# Patient Record
Sex: Male | Born: 1960 | Race: White | Hispanic: No | Marital: Married | Smoking: Never smoker
Health system: Southern US, Community
[De-identification: ages and names within clinical notes are randomized; demographics above are authoritative.]

## PROBLEM LIST (undated history)

## (undated) DIAGNOSIS — C6290 Malignant neoplasm of unspecified testis, unspecified whether descended or undescended: Secondary | ICD-10-CM

## (undated) DIAGNOSIS — J189 Pneumonia, unspecified organism: Secondary | ICD-10-CM

## (undated) HISTORY — DX: Malignant neoplasm of unspecified testis, unspecified whether descended or undescended: C62.90

## (undated) HISTORY — PX: WISDOM TOOTH EXTRACTION: SHX21

---

## 1982-11-02 DIAGNOSIS — C6290 Malignant neoplasm of unspecified testis, unspecified whether descended or undescended: Secondary | ICD-10-CM

## 1982-11-02 HISTORY — PX: OTHER SURGICAL HISTORY: SHX169

## 1982-11-02 HISTORY — PX: EXPLORATORY LAPAROTOMY: SUR591

## 1982-11-02 HISTORY — DX: Malignant neoplasm of unspecified testis, unspecified whether descended or undescended: C62.90

## 2002-09-15 ENCOUNTER — Encounter: Admission: RE | Admit: 2002-09-15 | Discharge: 2002-09-15 | Payer: Self-pay | Admitting: Sports Medicine

## 2003-06-08 ENCOUNTER — Encounter: Admission: RE | Admit: 2003-06-08 | Discharge: 2003-06-08 | Payer: Self-pay | Admitting: Sports Medicine

## 2003-06-15 ENCOUNTER — Encounter: Admission: RE | Admit: 2003-06-15 | Discharge: 2003-06-15 | Payer: Self-pay | Admitting: Sports Medicine

## 2005-01-23 ENCOUNTER — Encounter: Admission: RE | Admit: 2005-01-23 | Discharge: 2005-01-23 | Payer: Self-pay | Admitting: Surgery

## 2005-06-05 ENCOUNTER — Encounter: Payer: Self-pay | Admitting: Family Medicine

## 2007-02-09 ENCOUNTER — Encounter: Payer: Self-pay | Admitting: Family Medicine

## 2009-06-17 ENCOUNTER — Ambulatory Visit: Payer: Self-pay | Admitting: Family Medicine

## 2009-06-17 DIAGNOSIS — J45909 Unspecified asthma, uncomplicated: Secondary | ICD-10-CM

## 2009-06-18 LAB — CONVERTED CEMR LAB
ALT: 45 units/L (ref 0–53)
AST: 49 units/L — ABNORMAL HIGH (ref 0–37)
Albumin: 4.7 g/dL (ref 3.5–5.2)
Alkaline Phosphatase: 71 units/L (ref 39–117)
Basophils Absolute: 0 10*3/uL (ref 0.0–0.1)
Basophils Relative: 0.1 % (ref 0.0–3.0)
Calcium: 10 mg/dL (ref 8.4–10.5)
Eosinophils Relative: 0.4 % (ref 0.0–5.0)
GFR calc non Af Amer: 68.53 mL/min (ref >60)
HCT: 42.5 % (ref 39.0–52.0)
HDL: 51.3 mg/dL (ref >39.00)
Hemoglobin: 14.5 g/dL (ref 13.0–17.0)
Lymphocytes Relative: 17.1 % (ref 12.0–46.0)
Lymphs Abs: 0.9 10*3/uL (ref 0.7–4.0)
Monocytes Relative: 9.8 % (ref 3.0–12.0)
Neutro Abs: 4 10*3/uL (ref 1.4–7.7)
Potassium: 4.5 meq/L (ref 3.5–5.1)
RBC: 4.57 M/uL (ref 4.22–5.81)
Sodium: 147 meq/L — ABNORMAL HIGH (ref 135–145)
Total CHOL/HDL Ratio: 3
Total Protein: 6.7 g/dL (ref 6.0–8.3)
VLDL: 12 mg/dL (ref 0.0–40.0)

## 2009-06-19 ENCOUNTER — Encounter: Payer: Self-pay | Admitting: Family Medicine

## 2009-08-09 ENCOUNTER — Ambulatory Visit: Payer: Self-pay | Admitting: Family Medicine

## 2009-08-12 LAB — CONVERTED CEMR LAB
ALT: 27 units/L (ref 0–53)
AST: 35 units/L (ref 0–37)
Albumin: 4.5 g/dL (ref 3.5–5.2)
Alkaline Phosphatase: 70 units/L (ref 39–117)
Basophils Absolute: 0 10*3/uL (ref 0.0–0.1)
Basophils Relative: 0.5 % (ref 0.0–3.0)
Bilirubin, Direct: 0.1 mg/dL (ref 0.0–0.3)
Eosinophils Absolute: 0.1 10*3/uL (ref 0.0–0.7)
Eosinophils Relative: 1.7 % (ref 0.0–5.0)
HCT: 43.2 % (ref 39.0–52.0)
Hemoglobin: 14.9 g/dL (ref 13.0–17.0)
Lymphocytes Relative: 26.3 % (ref 12.0–46.0)
Lymphs Abs: 1.3 10*3/uL (ref 0.7–4.0)
MCHC: 34.4 g/dL (ref 30.0–36.0)
MCV: 94.3 fL (ref 78.0–100.0)
Monocytes Absolute: 0.6 10*3/uL (ref 0.1–1.0)
Monocytes Relative: 10.9 % (ref 3.0–12.0)
Neutro Abs: 3.1 10*3/uL (ref 1.4–7.7)
Neutrophils Relative %: 60.6 % (ref 43.0–77.0)
Platelets: 141 10*3/uL — ABNORMAL LOW (ref 150.0–400.0)
RBC: 4.58 M/uL (ref 4.22–5.81)
RDW: 12.3 % (ref 11.5–14.6)
Total Bilirubin: 1 mg/dL (ref 0.3–1.2)
Total Protein: 6.2 g/dL (ref 6.0–8.3)
WBC: 5.1 10*3/uL (ref 4.5–10.5)

## 2009-09-16 ENCOUNTER — Ambulatory Visit: Payer: Self-pay | Admitting: Family Medicine

## 2009-09-16 DIAGNOSIS — H66009 Acute suppurative otitis media without spontaneous rupture of ear drum, unspecified ear: Secondary | ICD-10-CM | POA: Insufficient documentation

## 2009-09-16 DIAGNOSIS — J0100 Acute maxillary sinusitis, unspecified: Secondary | ICD-10-CM

## 2009-09-23 ENCOUNTER — Telehealth: Payer: Self-pay | Admitting: Family Medicine

## 2009-10-11 ENCOUNTER — Ambulatory Visit: Payer: Self-pay | Admitting: Family Medicine

## 2009-10-23 ENCOUNTER — Telehealth: Payer: Self-pay | Admitting: Family Medicine

## 2010-10-01 ENCOUNTER — Telehealth: Payer: Self-pay | Admitting: Family Medicine

## 2010-10-06 ENCOUNTER — Ambulatory Visit: Payer: Self-pay | Admitting: Family Medicine

## 2010-10-16 ENCOUNTER — Ambulatory Visit: Payer: Self-pay | Admitting: Family Medicine

## 2010-10-16 LAB — CONVERTED CEMR LAB
Alkaline Phosphatase: 71 units/L (ref 39–117)
Basophils Absolute: 0 10*3/uL (ref 0.0–0.1)
Bilirubin, Direct: 0.2 mg/dL (ref 0.0–0.3)
Blood, UA: NEGATIVE
CO2: 27 meq/L (ref 19–32)
Calcium: 9.6 mg/dL (ref 8.4–10.5)
Creatinine, Ser: 0.9 mg/dL (ref 0.4–1.5)
Eosinophils Absolute: 0.1 10*3/uL (ref 0.0–0.7)
Glucose, Bld: 77 mg/dL (ref 70–99)
HDL: 54.2 mg/dL (ref >39.00)
Lymphocytes Relative: 30 % (ref 12.0–46.0)
MCHC: 33.9 g/dL (ref 30.0–36.0)
Neutrophils Relative %: 55 % (ref 43.0–77.0)
Nitrite: NEGATIVE
RDW: 13.1 % (ref 11.5–14.6)
Total CHOL/HDL Ratio: 3
Total Protein, Urine: 30 mg/dL
Triglycerides: 72 mg/dL (ref 0.0–149.0)
pH: 5.5 (ref 5.0–8.0)

## 2010-10-22 ENCOUNTER — Ambulatory Visit: Payer: Self-pay | Admitting: Family Medicine

## 2010-10-23 ENCOUNTER — Ambulatory Visit: Payer: Self-pay | Admitting: Family Medicine

## 2010-12-02 NOTE — Progress Notes (Signed)
Summary: Pt req to get psa added to cpx labs  Phone Note Call from Patient Call back at Home Phone [phone]   Caller: Patient Summary of Call: Pt called and is req to has psa added to cpx labs on 10/16/10. Pls advise.  Initial call taken by: Lucy Antigua,  October 01, 2010 11:57 AM  Follow-up for Phone Call        OK to add Follow-up by: [name] MD,  October 01, 2010 12:13 PM  Additional Follow-up for Phone Call Additional follow up Details #1::        Pt has been notified and psa lab has been added to cpx labs, as noted.  Additional Follow-up by: Lucy Antigua,  October 01, 2010 1:10 PM

## 2010-12-02 NOTE — Assessment & Plan Note (Signed)
Summary: flu shot/cjr  Nurse Visit Flu Vaccine Consent Questions     Do you have a history of severe allergic reactions to this vaccine? no    Any prior history of allergic reactions to egg and/or gelatin? no    Do you have a sensitivity to the preservative Thimersol? no    Do you have a past history of Guillan-Barre Syndrome? no    Do you currently have an acute febrile illness? no    Have you ever had a severe reaction to latex? no    Vaccine information given and explained to patient? yes    Are you currently pregnant? no    Lot Number:[id number]   Exp Date:05/02/2011   Site Given  Right Deltoid IM   Vitals Entered By: Sid Falcon LPN (October 06, 2010 12:35 PM)  Allergies: No Known Drug Allergies  Orders Added: 1)  Admin 1st Vaccine [90471] 2)  Flu Vaccine 47yrs + [16109]

## 2010-12-04 NOTE — Assessment & Plan Note (Signed)
Summary: CPX/nn   Vital Signs:  Patient profile:   50 year old male Height:      70.75 inches Weight:      147 pounds BMI:     20.72 Temp:     98.1 degrees F oral Pulse rate:   60 / minute Pulse rhythm:   regular Resp:     12 per minute BP sitting:   118 / 70  (left arm) Cuff size:   regular  Vitals Entered By: Sid Falcon LPN (October 22, 2010 3:10 PM)  History of Present Illness: Here for CPE.  Exercising regularly. Tetanus up to date.  Will turn 50 in a few months. PMH, SH, AND FH  reviewed. Remote hx of testicluar cancer.  Clinical Review Panels:  Immunizations   Last Tetanus Booster:  Historical (10/17/2004)   Last Flu Vaccine:  Fluvax 3+ (10/06/2010)   Last Pneumovax:  Pneumovax (06/17/2009)  Lipid Management   Cholesterol:  181 (10/16/2010)   LDL (bad choesterol):  112 (10/16/2010)   HDL (good cholesterol):  54.20 (10/16/2010)  Diabetes Management   Creatinine:  0.9 (10/16/2010)   Last Flu Vaccine:  Fluvax 3+ (10/06/2010)   Last Pneumovax:  Pneumovax (06/17/2009)  CBC   WBC:  3.7 (10/16/2010)   RBC:  4.70 (10/16/2010)   Hgb:  15.0 (10/16/2010)   Hct:  44.4 (10/16/2010)   Platelets:  128.0 (10/16/2010)   MCV  94.5 (10/16/2010)   MCHC  33.9 (10/16/2010)   RDW  13.1 (10/16/2010)   PMN:  55.0 (10/16/2010)   Lymphs:  30.0 (10/16/2010)   Monos:  12.1 (10/16/2010)   Eosinophils:  1.9 (10/16/2010)   Basophil:  1.0 (10/16/2010)  Complete Metabolic Panel   Glucose:  77 (10/16/2010)   Sodium:  143 (10/16/2010)   Potassium:  4.0 (10/16/2010)   Chloride:  108 (10/16/2010)   CO2:  27 (10/16/2010)   BUN:  22 (10/16/2010)   Creatinine:  0.9 (10/16/2010)   Albumin:  4.3 (10/16/2010)   Total Protein:  6.2 (10/16/2010)   Calcium:  9.6 (10/16/2010)   Total Bili:  0.9 (10/16/2010)   Alk Phos:  71 (10/16/2010)   SGPT (ALT):  25 (10/16/2010)   SGOT (AST):  33 (10/16/2010)   Allergies (verified): No Known Drug Allergies  Past History:  Past Medical  History: Last updated: 06/17/2009 Chicken oox Cancer, Malignancy 1984  Past Surgical History: Last updated: 06/17/2009 Testicular cancer, 1984  Family History: Last updated: 06/17/2009 Family History of Alcoholism/Addiction, blood relative  Social History: Last updated: 06/17/2009 Occupation:  Sales Married Never Smoked Alcohol use-yes Regular exercise-yes  Risk Factors: Exercise: yes (06/17/2009)  Risk Factors: Smoking Status: never (06/17/2009) PMH-FH-SH reviewed for relevance  Review of Systems  The patient denies anorexia, fever, weight loss, weight gain, vision loss, decreased hearing, hoarseness, chest pain, syncope, dyspnea on exertion, peripheral edema, prolonged cough, headaches, hemoptysis, abdominal pain, melena, hematochezia, severe indigestion/heartburn, hematuria, incontinence, genital sores, muscle weakness, suspicious skin lesions, transient blindness, difficulty walking, depression, unusual weight change, abnormal bleeding, enlarged lymph nodes, and testicular masses.    Physical Exam  General:  Well-developed,well-nourished,in no acute distress; alert,appropriate and cooperative throughout examination Eyes:  No corneal or conjunctival inflammation noted. EOMI. Perrla. Funduscopic exam benign, without hemorrhages, exudates or papilledema. Vision grossly normal. Ears:  External ear exam shows no significant lesions or deformities.  Otoscopic examination reveals clear canals, tympanic membranes are intact bilaterally without bulging, retraction, inflammation or discharge. Hearing is grossly normal bilaterally. Mouth:  Oral mucosa and oropharynx without lesions  or exudates.  Teeth in good repair. Neck:  No deformities, masses, or tenderness noted. Lungs:  Normal respiratory effort, chest expands symmetrically. Lungs are clear to auscultation, no crackles or wheezes. Heart:  Normal rate and regular rhythm. S1 and S2 normal without gallop, murmur, click, rub or  other extra sounds. Abdomen:  Bowel sounds positive,abdomen soft and non-tender without masses, organomegaly or hernias noted. Rectal:  No external abnormalities noted. Normal sphincter tone. No rectal masses or tenderness. Genitalia:  one testicle present and without mass. Prostate:  Prostate gland firm and smooth, no enlargement, nodularity, tenderness, mass, asymmetry or induration. Msk:  No deformity or scoliosis noted of thoracic or lumbar spine.   Extremities:  No clubbing, cyanosis, edema, or deformity noted with normal full range of motion of all joints.   Neurologic:  No cranial nerve deficits noted. Station and gait are normal. Plantar reflexes are down-going bilaterally. DTRs are symmetrical throughout. Sensory, motor and coordinative functions appear intact. Skin:  Intact without suspicious lesions or rashes Cervical Nodes:  No lymphadenopathy noted Psych:  good eye contact, not anxious appearing, and not depressed appearing.     Impression & Recommendations:  Problem # 1:  Preventive Health Care (ICD-V70.0) labs reviewed with pt. cont reg exercise.  Will need screenig colonoscopy next year and he will call after age 28.  Complete Medication List: 1)  One-a-day Mens Tabs (Multiple vitamin) .... Once daily 2)  Advair Diskus 100-50 Mcg/dose Aepb (Fluticasone-salmeterol) .... One puff two times a day 3)  Amoxicillin-pot Clavulanate 875-125 Mg Tabs (Amoxicillin-pot clavulanate) .... One by mouth two times a day for 10 days 4)  Vitamin C 1000 Mg Tabs (Ascorbic acid) .... Once daily 5)  Calcium 1500 Mg Tabs (Calcium carbonate) .... Once daily  Patient Instructions: 1)  Consider screening colonoscopy sometime next year after age 53. 2)  Please schedule a follow-up appointment in 1 year.    Orders Added: 1)  Est. Patient 40-64 years [[id number]]

## 2011-08-24 ENCOUNTER — Telehealth: Payer: Self-pay | Admitting: Family Medicine

## 2011-08-24 DIAGNOSIS — Z1211 Encounter for screening for malignant neoplasm of colon: Secondary | ICD-10-CM

## 2011-08-24 NOTE — Telephone Encounter (Signed)
Pt informed on home VM 

## 2011-08-24 NOTE — Telephone Encounter (Signed)
Pt is interested in getting a colonoscopy done. Please contact.

## 2011-08-24 NOTE — Telephone Encounter (Signed)
OK.  I will go ahead with referral.

## 2011-08-24 NOTE — Telephone Encounter (Signed)
Please advise 

## 2011-08-28 ENCOUNTER — Ambulatory Visit (INDEPENDENT_AMBULATORY_CARE_PROVIDER_SITE_OTHER): Payer: PRIVATE HEALTH INSURANCE

## 2011-08-28 DIAGNOSIS — Z23 Encounter for immunization: Secondary | ICD-10-CM

## 2011-09-22 ENCOUNTER — Ambulatory Visit (AMBULATORY_SURGERY_CENTER): Payer: PRIVATE HEALTH INSURANCE | Admitting: *Deleted

## 2011-09-22 VITALS — Ht 71.0 in | Wt 146.5 lb

## 2011-09-22 DIAGNOSIS — Z1211 Encounter for screening for malignant neoplasm of colon: Secondary | ICD-10-CM

## 2011-09-22 MED ORDER — PEG-KCL-NACL-NASULF-NA ASC-C 100 G PO SOLR: ORAL | Status: DC

## 2011-10-02 ENCOUNTER — Ambulatory Visit (AMBULATORY_SURGERY_CENTER): Payer: PRIVATE HEALTH INSURANCE | Admitting: Internal Medicine

## 2011-10-02 ENCOUNTER — Encounter: Payer: Self-pay | Admitting: Internal Medicine

## 2011-10-02 VITALS — HR 48 | Temp 97.2°F | Resp 23 | Ht 71.0 in | Wt 146.0 lb

## 2011-10-02 DIAGNOSIS — Z1211 Encounter for screening for malignant neoplasm of colon: Secondary | ICD-10-CM

## 2011-10-02 MED ORDER — SODIUM CHLORIDE 0.9 % IV SOLN: 500.0000 mL | INTRAVENOUS | Status: DC

## 2011-10-02 NOTE — Progress Notes (Signed)
Per the pt "I have a low heart rate".  Heart rate 47 when first placed on the monitor.  Dr. Rhea Belton is aware. Maw  Pt tolerated the colonoscopy very well. Maw

## 2011-10-02 NOTE — Patient Instructions (Signed)
Discharge instructions given with verbal understanding. Resume previous medications.  

## 2011-10-02 NOTE — Progress Notes (Signed)
Patient did not experience any of the following events: a burn prior to discharge; a fall within the facility; wrong site/side/patient/procedure/implant event; or a hospital transfer or hospital admission upon discharge from the facility. (G8907) Patient did not have preoperative order for IV antibiotic SSI prophylaxis. (G8918)  

## 2011-10-05 ENCOUNTER — Telehealth: Payer: Self-pay

## 2011-10-05 NOTE — Telephone Encounter (Signed)

## 2011-10-19 ENCOUNTER — Other Ambulatory Visit (INDEPENDENT_AMBULATORY_CARE_PROVIDER_SITE_OTHER): Payer: PRIVATE HEALTH INSURANCE

## 2011-10-19 DIAGNOSIS — Z0000 Encounter for general adult medical examination without abnormal findings: Secondary | ICD-10-CM

## 2011-10-19 LAB — HEPATIC FUNCTION PANEL
AST: 37 U/L (ref 0–37)
Alkaline Phosphatase: 68 U/L (ref 39–117)
Bilirubin, Direct: 0.1 mg/dL (ref 0.0–0.3)
Total Bilirubin: 1.1 mg/dL (ref 0.3–1.2)

## 2011-10-19 LAB — POCT URINALYSIS DIPSTICK
Leukocytes, UA: NEGATIVE
Urobilinogen, UA: NEGATIVE
pH, UA: 5

## 2011-10-19 LAB — LIPID PANEL
LDL Cholesterol: 119 mg/dL — ABNORMAL HIGH (ref 0–99)
Total CHOL/HDL Ratio: 3
VLDL: 15.6 mg/dL (ref 0.0–40.0)

## 2011-10-19 LAB — BASIC METABOLIC PANEL
Calcium: 9.6 mg/dL (ref 8.4–10.5)
GFR: 80.97 mL/min (ref >60.00)
Glucose, Bld: 87 mg/dL (ref 70–99)
Sodium: 145 mEq/L (ref 135–145)

## 2011-10-19 LAB — CBC WITH DIFFERENTIAL/PLATELET
Basophils Absolute: 0 10*3/uL (ref 0.0–0.1)
Hemoglobin: 14.1 g/dL (ref 13.0–17.0)
Lymphocytes Relative: 24.7 % (ref 12.0–46.0)
Monocytes Relative: 11.2 % (ref 3.0–12.0)
Neutro Abs: 3 10*3/uL (ref 1.4–7.7)
Neutrophils Relative %: 62 % (ref 43.0–77.0)
Platelets: 144 10*3/uL — ABNORMAL LOW (ref 150.0–400.0)
RDW: 13.6 % (ref 11.5–14.6)

## 2011-10-19 LAB — PSA: PSA: 0.21 ng/mL (ref 0.10–4.00)

## 2011-10-26 ENCOUNTER — Encounter: Payer: Self-pay | Admitting: Family Medicine

## 2011-10-26 ENCOUNTER — Ambulatory Visit (INDEPENDENT_AMBULATORY_CARE_PROVIDER_SITE_OTHER): Payer: PRIVATE HEALTH INSURANCE | Admitting: Family Medicine

## 2011-10-26 VITALS — BP 120/62 | HR 52 | Temp 97.7°F | Resp 12 | Ht 71.75 in | Wt 147.0 lb

## 2011-10-26 DIAGNOSIS — Z0000 Encounter for general adult medical examination without abnormal findings: Secondary | ICD-10-CM

## 2011-10-26 NOTE — Progress Notes (Signed)
  Subjective:    Patient ID: Harold Benton, male    DOB: 1961/04/29, 50 y.o.   MRN: [id number]  HPI  Patient seen for complete physical. Generally very healthy. Runs about 25 miles per week. No regular medications. Immunizations reviewed and up-to-date. Colonoscopy recently normal. Remote history of testicular cancer. Generally feels well. Has occasional problems with erectile dysfunction.   Review of Systems  Constitutional: Negative for fever, activity change, appetite change and fatigue.  HENT: Negative for ear pain, congestion and trouble swallowing.   Eyes: Negative for pain and visual disturbance.  Respiratory: Negative for cough, shortness of breath and wheezing.   Cardiovascular: Negative for chest pain and palpitations.  Gastrointestinal: Negative for nausea, vomiting, abdominal pain, diarrhea, constipation, blood in stool, abdominal distention and rectal pain.  Genitourinary: Negative for dysuria, hematuria and testicular pain.  Musculoskeletal: Negative for joint swelling and arthralgias.  Skin: Negative for rash.  Neurological: Negative for dizziness, syncope and headaches.  Hematological: Negative for adenopathy.  Psychiatric/Behavioral: Negative for confusion and dysphoric mood.       Objective:   Physical Exam  Constitutional: He is oriented to person, place, and time. He appears well-developed and well-nourished. No distress.  HENT:  Head: Normocephalic and atraumatic.  Right Ear: External ear normal.  Left Ear: External ear normal.  Mouth/Throat: Oropharynx is clear and moist.  Eyes: Conjunctivae and EOM are normal. Pupils are equal, round, and reactive to light.  Neck: Normal range of motion. Neck supple. No thyromegaly present.  Cardiovascular: Normal rate, regular rhythm and normal heart sounds.   No murmur heard. Pulmonary/Chest: No respiratory distress. He has no wheezes. He has no rales.  Abdominal: Soft. Bowel sounds are normal. He exhibits no distension  and no mass. There is no tenderness. There is no rebound and no guarding.  Musculoskeletal: He exhibits no edema.  Lymphadenopathy:    He has no cervical adenopathy.  Neurological: He is alert and oriented to person, place, and time. He displays normal reflexes. No cranial nerve deficit.  Skin: No rash noted.  Psychiatric: He has a normal mood and affect.          Assessment & Plan:  Healthy 50 year old male. Labs reviewed with patient and all favorable. Continue regular exercise. Colonoscopy earlier this year normal. Followup complete physical in 1 year

## 2011-10-26 NOTE — Patient Instructions (Signed)
Cialis 5mg  once daily as needed.

## 2011-12-18 ENCOUNTER — Encounter: Payer: Self-pay | Admitting: Family Medicine

## 2011-12-18 ENCOUNTER — Ambulatory Visit (INDEPENDENT_AMBULATORY_CARE_PROVIDER_SITE_OTHER): Payer: PRIVATE HEALTH INSURANCE | Admitting: Family Medicine

## 2011-12-18 VITALS — BP 108/68 | Temp 98.3°F | Wt 148.0 lb

## 2011-12-18 DIAGNOSIS — J209 Acute bronchitis, unspecified: Secondary | ICD-10-CM

## 2011-12-18 MED ORDER — ALBUTEROL SULFATE HFA 108 (90 BASE) MCG/ACT IN AERS: 2.0000 | INHALATION_SPRAY | Freq: Four times a day (QID) | RESPIRATORY_TRACT | Status: DC | PRN

## 2011-12-18 MED ORDER — HYDROCODONE-HOMATROPINE 5-1.5 MG/5ML PO SYRP: 5.0000 mL | ORAL_SOLUTION | Freq: Four times a day (QID) | ORAL | Status: AC | PRN

## 2011-12-18 NOTE — Progress Notes (Signed)
  Subjective:    Patient ID: Harold Benton, male    DOB: Oct 02, 1961, 51 y.o.   MRN: [id number]  HPI  Acute visit. 2 week history of dry cough. Started with upper history symptoms. He had some nasal congestion initially had some yellowish nasal discharge now clear. Possible mild wheezing off and on. Denies any headaches or localized facial pain. Has tried Robitussin-DM without relief of cough. Cough is especially bothersome at night. Patient is nonsmoker. Does have history of mild intermittent asthma   Review of Systems  Constitutional: Negative for fever and chills.  HENT: Positive for congestion.   Respiratory: Positive for cough and wheezing. Negative for shortness of breath.   Neurological: Negative for headaches.       Objective:   Physical Exam  Constitutional: He appears well-developed and well-nourished.  HENT:  Right Ear: External ear normal.  Left Ear: External ear normal.  Mouth/Throat: Oropharynx is clear and moist.  Neck: Neck supple.  Cardiovascular: Normal rate and regular rhythm.   Pulmonary/Chest: Effort normal and breath sounds normal. No respiratory distress. He has no wheezes. He has no rales.  Lymphadenopathy:    He has no cervical adenopathy.          Assessment & Plan:  Probable viral syndrome. Possible mild reactive airway component. Refill Ventolin for asthma prn use. Hycodan cough syrup for nighttime use. Follow up promptly for fever or worsening symptoms. Consider prednisone taper if cough not improving though no wheezing heard today on exam

## 2011-12-25 ENCOUNTER — Telehealth: Payer: Self-pay | Admitting: Family Medicine

## 2011-12-25 MED ORDER — AMOXICILLIN-POT CLAVULANATE 875-125 MG PO TABS: 1.0000 | ORAL_TABLET | Freq: Two times a day (BID) | ORAL | Status: AC

## 2011-12-25 NOTE — Telephone Encounter (Signed)
Pt called. Was in to see Dr. Leonard Schwartz last Friday. He is ready get an antibiotic now. Last wk they discussed it and he decided to wait a little. Please call something in. Pt uses [facility] in [place].

## 2011-12-25 NOTE — Telephone Encounter (Signed)
Augmentin 875 mg one BID for 10 days

## 2011-12-25 NOTE — Telephone Encounter (Signed)
Please advise 

## 2011-12-25 NOTE — Telephone Encounter (Signed)
Pt informed. Rx sent to pharmacy  

## 2013-03-17 ENCOUNTER — Other Ambulatory Visit (INDEPENDENT_AMBULATORY_CARE_PROVIDER_SITE_OTHER): Payer: PRIVATE HEALTH INSURANCE

## 2013-03-17 DIAGNOSIS — Z0000 Encounter for general adult medical examination without abnormal findings: Secondary | ICD-10-CM

## 2013-03-17 LAB — CBC WITH DIFFERENTIAL/PLATELET
Basophils Absolute: 0.1 10*3/uL (ref 0.0–0.1)
Eosinophils Relative: 0.7 % (ref 0.0–5.0)
Lymphs Abs: 0.7 10*3/uL (ref 0.7–4.0)
MCV: 88.9 fl (ref 78.0–100.0)
Monocytes Absolute: 0.8 10*3/uL (ref 0.1–1.0)
Neutrophils Relative %: 78.5 % — ABNORMAL HIGH (ref 43.0–77.0)
Platelets: 140 10*3/uL — ABNORMAL LOW (ref 150.0–400.0)
RDW: 14.2 % (ref 11.5–14.6)
WBC: 7.7 10*3/uL (ref 4.5–10.5)

## 2013-03-17 LAB — POCT URINALYSIS DIPSTICK
Bilirubin, UA: NEGATIVE
Blood, UA: NEGATIVE
Glucose, UA: NEGATIVE
Leukocytes, UA: NEGATIVE
Nitrite, UA: NEGATIVE
Urobilinogen, UA: 0.2

## 2013-03-17 LAB — BASIC METABOLIC PANEL
BUN: 21 mg/dL (ref 6–23)
Creatinine, Ser: 1 mg/dL (ref 0.4–1.5)
GFR: 85.28 mL/min (ref >60.00)
Glucose, Bld: 79 mg/dL (ref 70–99)

## 2013-03-17 LAB — HEPATIC FUNCTION PANEL
ALT: 30 U/L (ref 0–53)
Bilirubin, Direct: 0.1 mg/dL (ref 0.0–0.3)
Total Bilirubin: 0.9 mg/dL (ref 0.3–1.2)

## 2013-03-17 LAB — LIPID PANEL
Cholesterol: 171 mg/dL (ref 0–200)
HDL: 53.6 mg/dL (ref >39.00)
VLDL: 15.2 mg/dL (ref 0.0–40.0)

## 2013-03-17 LAB — TSH: TSH: 1.62 u[IU]/mL (ref 0.35–5.50)

## 2013-03-17 LAB — PSA: PSA: 0.17 ng/mL (ref 0.10–4.00)

## 2013-03-24 ENCOUNTER — Encounter: Payer: Self-pay | Admitting: Family Medicine

## 2013-03-24 ENCOUNTER — Ambulatory Visit (INDEPENDENT_AMBULATORY_CARE_PROVIDER_SITE_OTHER): Payer: PRIVATE HEALTH INSURANCE | Admitting: Family Medicine

## 2013-03-24 VITALS — BP 130/90 | Temp 98.1°F | Ht 71.25 in | Wt 149.0 lb

## 2013-03-24 DIAGNOSIS — Z0000 Encounter for general adult medical examination without abnormal findings: Secondary | ICD-10-CM

## 2013-03-24 MED ORDER — MONTELUKAST SODIUM 10 MG PO TABS: 10.0000 mg | ORAL_TABLET | Freq: Every day | ORAL | Status: DC

## 2013-03-24 MED ORDER — FLUTICASONE-SALMETEROL 100-50 MCG/DOSE IN AEPB: 1.0000 | INHALATION_SPRAY | Freq: Two times a day (BID) | RESPIRATORY_TRACT | Status: DC

## 2013-03-24 NOTE — Progress Notes (Addendum)
Subjective:    Patient ID: Harold Benton, male    DOB: February 01, 1961, 52 y.o.   MRN: [id number]  HPI Here for complete physical. Patient has history of asthma but took himself off inhalers some time back. He has several months of cough, wheezing, and increased congestion both nasally and his chest. His tried multiple medications including Mucinex, Sudafed, and Advil without much improvement Still running but has increased shortness of breath. No chest pain. Previously took Advair which worked well.  Colonoscopy 2012. Tetanus 2005. Other than issues above, feels well. Remote history of testicle cancer.  Past Medical History  Diagnosis Date  . Testicular cancer 1984   Past Surgical History  Procedure Laterality Date  . Tecticle removal  1984  . Exploratory laparotomy  1984    reports that he has never smoked. He has never used smokeless tobacco. He reports that he drinks about 3.6 ounces of alcohol per week. He reports that he does not use illicit drugs. family history includes Cancer in his father and mother.  There is no history of Colon cancer and Stomach cancer. No Known Allergies    Review of Systems  Constitutional: Negative for fever, activity change, appetite change and fatigue.  HENT: Negative for ear pain, congestion and trouble swallowing.   Eyes: Negative for pain and visual disturbance.  Respiratory: Positive for cough, shortness of breath and wheezing. Negative for apnea.   Cardiovascular: Negative for chest pain and palpitations.  Gastrointestinal: Negative for nausea, vomiting, abdominal pain, diarrhea, constipation, blood in stool, abdominal distention and rectal pain.  Genitourinary: Negative for dysuria, hematuria and testicular pain.  Musculoskeletal: Negative for joint swelling and arthralgias.  Skin: Negative for rash.  Neurological: Negative for dizziness, syncope and headaches.  Hematological: Negative for adenopathy.  Psychiatric/Behavioral: Negative  for confusion and dysphoric mood.       Objective:   Physical Exam  Constitutional: He is oriented to person, place, and time. He appears well-developed and well-nourished. No distress.  HENT:  Head: Normocephalic and atraumatic.  Right Ear: External ear normal.  Left Ear: External ear normal.  Mouth/Throat: Oropharynx is clear and moist.  Eyes: Conjunctivae and EOM are normal. Pupils are equal, round, and reactive to light.  Neck: Normal range of motion. Neck supple. No thyromegaly present.  Cardiovascular: Normal rate, regular rhythm and normal heart sounds.   No murmur heard. Pulmonary/Chest: Effort normal. No respiratory distress. He has wheezes. He has no rales.  Abdominal: Soft. Bowel sounds are normal. He exhibits no distension and no mass. There is no tenderness. There is no rebound and no guarding.  Musculoskeletal: He exhibits no edema.  Lymphadenopathy:    He has no cervical adenopathy.  Neurological: He is alert and oriented to person, place, and time. He displays normal reflexes. No cranial nerve deficit.  Skin: No rash noted.  Psychiatric: He has a normal mood and affect.          Assessment & Plan:  Complete physical. Labs reviewed. Tetanus booster by next year. Colonoscopy up to date. He has evidence for reactive airway disease which is currently not treated. Start back Advair 100 mg one puff twice daily. Singulair 10 mg once daily. Touch base 2 weeks if no significant improvement  Spoke with patient in followup. He just started Advair 2 days ago. Has also started Singulair. Had low-grade fever 100.4 yesterday. Cough with thick sputum. We elected to go ahead and start Zithromax for 5 days. Followup has been scheduled for tomorrow to reassess

## 2013-03-29 ENCOUNTER — Telehealth: Payer: Self-pay | Admitting: Family Medicine

## 2013-03-29 ENCOUNTER — Encounter: Payer: Self-pay | Admitting: Family Medicine

## 2013-03-29 MED ORDER — AZITHROMYCIN 250 MG PO TABS: ORAL_TABLET | ORAL | Status: AC

## 2013-03-29 NOTE — Addendum Note (Signed)
Addended by: [name] on: 03/29/2013 06:12 PM   Modules accepted: Orders

## 2013-03-29 NOTE — Telephone Encounter (Signed)
Patient Information:  Caller Name: Shilo  Phone: [phone]  Patient: Harold Benton, Harold Benton  Gender: Male  DOB: February 27, 1961  Age: 52 Years  PCP: [name] [facility])  Office Follow Up:  Does the office need to follow up with this patient?: No  Instructions For The Office: N/A    Symptoms  Reason For Call & Symptoms: Ongoing Sinus and chest congestion.  Pt seen on 5/23 and prescribed an inhaler and singulair.  Productive cough with green mucus and nasal dicharge as well.  Lots of coughing.  Pt concerned he spiked a fever on 03/27/13 which has resolved.   Reviewed Health History In EMR: Yes  Reviewed Medications In EMR: Yes  Reviewed Allergies In EMR: Yes  Reviewed Surgeries / Procedures: Yes  Date of Onset of Symptoms: 03/08/2013  Treatments Tried: RX Nasal Washes  Treatments Tried Worked: No  Guideline(s) Used:  Sinus Pain and Congestion  Disposition Per Guideline:   See Today or Tomorrow in Office  Reason For Disposition Reached:   Lots of coughing  Advice Given:  Hydration:  Drink plenty of liquids (6-8 glasses of water daily). If the air in your home is dry, use a cool mist humidifier  Call Back If:   You become worse.  Patient Will Follow Care Advice:  YES  Appointment Scheduled:  03/30/2013 16:30:00 Appointment Scheduled Provider:  [name] [facility])

## 2013-03-30 ENCOUNTER — Ambulatory Visit: Payer: Self-pay | Admitting: Family Medicine

## 2013-07-27 ENCOUNTER — Encounter: Payer: Self-pay | Admitting: Family Medicine

## 2013-07-27 MED ORDER — AMOXICILLIN-POT CLAVULANATE 875-125 MG PO TABS: 1.0000 | ORAL_TABLET | Freq: Two times a day (BID) | ORAL | Status: DC

## 2013-07-27 NOTE — Telephone Encounter (Signed)
Patient calls with symptoms of progressive sinus congestion, headaches, eyes matting. History of frequent sinusitis the past. Refill Augmentin 875 mg twice daily for 10 days. Office followup if no better in one week

## 2013-09-01 ENCOUNTER — Ambulatory Visit (INDEPENDENT_AMBULATORY_CARE_PROVIDER_SITE_OTHER): Payer: PRIVATE HEALTH INSURANCE

## 2013-09-01 DIAGNOSIS — Z23 Encounter for immunization: Secondary | ICD-10-CM

## 2014-01-30 ENCOUNTER — Encounter: Payer: Self-pay | Admitting: Family Medicine

## 2014-01-31 ENCOUNTER — Encounter: Payer: Self-pay | Admitting: Family Medicine

## 2014-01-31 ENCOUNTER — Ambulatory Visit (INDEPENDENT_AMBULATORY_CARE_PROVIDER_SITE_OTHER): Payer: PRIVATE HEALTH INSURANCE | Admitting: Family Medicine

## 2014-01-31 VITALS — BP 120/70 | HR 54 | Temp 98.3°F | Wt 150.0 lb

## 2014-01-31 DIAGNOSIS — R05 Cough: Secondary | ICD-10-CM

## 2014-01-31 DIAGNOSIS — R059 Cough, unspecified: Secondary | ICD-10-CM

## 2014-01-31 MED ORDER — AZITHROMYCIN 250 MG PO TABS: ORAL_TABLET | ORAL | Status: AC

## 2014-01-31 NOTE — Progress Notes (Signed)
Pre visit review using our clinic review tool, if applicable. No additional management support is needed unless otherwise documented below in the visit note. 

## 2014-01-31 NOTE — Patient Instructions (Signed)
Consider stepping Advair back up to one puff twice daily

## 2014-01-31 NOTE — Progress Notes (Signed)
   Subjective:    Patient ID: Harold Benton, male    DOB: 08-14-1961, 53 y.o.   MRN: [id number]  Cough Associated symptoms include wheezing. Pertinent negatives include no chills or fever.   Acute. One-week history of difficult. He started with some sore throat and sinus congestion. Low-grade fever off-and-on. He has history of asthma and takes Singulair and Advair no curly taking Advair once daily. Nonsmoker. Cough productive of thick yellow sputum.  Past Medical History  Diagnosis Date  . Testicular cancer 1984   Past Surgical History  Procedure Laterality Date  . Tecticle removal  1984  . Exploratory laparotomy  1984    reports that he has never smoked. He has never used smokeless tobacco. He reports that he drinks about 3.6 ounces of alcohol per week. He reports that he does not use illicit drugs. family history includes Cancer in his father and mother. There is no history of Colon cancer or Stomach cancer. No Known Allergies    Review of Systems  Constitutional: Positive for fatigue. Negative for fever and chills.  HENT: Positive for congestion.   Respiratory: Positive for cough and wheezing.        Objective:   Physical Exam  Constitutional: He appears well-developed and well-nourished.  HENT:  Right Ear: External ear normal.  Left Ear: External ear normal.  Mouth/Throat: Oropharynx is clear and moist.  Neck: Neck supple.  Cardiovascular: Normal rate and regular rhythm.   Pulmonary/Chest: Effort normal and breath sounds normal. No respiratory distress. He has no wheezes. He has no rales.  Lymphadenopathy:    He has no cervical adenopathy.          Assessment & Plan:  Acute upper respiratory illness. He has productive cough and history of asthma. Continue Advair but increase to one puff twice daily. Continue albuterol as needed. Zithromax for 5 days.

## 2014-04-05 ENCOUNTER — Other Ambulatory Visit: Payer: Self-pay | Admitting: Family Medicine

## 2014-04-26 ENCOUNTER — Other Ambulatory Visit: Payer: Self-pay | Admitting: Family Medicine

## 2014-09-24 ENCOUNTER — Telehealth: Payer: Self-pay

## 2014-09-24 ENCOUNTER — Encounter: Payer: Self-pay | Admitting: Family Medicine

## 2014-09-24 NOTE — Telephone Encounter (Signed)
Dr. Elease Hashimoto,     I'm in my 5th week of another sinus/chest congestion episode. I've been taking Phenylephrine HCl (10 mg x 4 daily), Guaifenesin (600 mg x 2 daily), and Advil (200 mg. x 8 daily) for at least 3 weeks. That's in addition to my usual Advair and Cingulair. Mucus still very green from both sinuses and chest. Have felt feverish at times, but Advil keeping that at [place].    I'm ready to try an anti-biotic; would you please prescribe?    RX still at [facility]     Do you want patient to come into office. Last visit 01/31/14

## 2014-09-25 ENCOUNTER — Other Ambulatory Visit: Payer: Self-pay

## 2014-09-25 MED ORDER — AZITHROMYCIN 250 MG PO TABS: ORAL_TABLET | ORAL | Status: DC

## 2014-09-25 NOTE — Telephone Encounter (Signed)
Rx sent to pharmacy   

## 2014-09-25 NOTE — Telephone Encounter (Signed)
Let's start Zithromax (Z-pack) and office follow up if no better after that.

## 2014-10-19 ENCOUNTER — Telehealth: Payer: Self-pay | Admitting: Family Medicine

## 2014-10-19 NOTE — Telephone Encounter (Signed)
Patient states he was on a Z-pack last month and the chest congestion has come back.  Can the medication be re-filled or does he need an appointment?? If he needs an appointment, can it wait so he can see Dr. Elease Hashimoto or go to Saturday clinic?  [id number] - [place], [place] - [address]

## 2014-10-21 NOTE — Telephone Encounter (Signed)
If still having respiratory/infectious symptoms, recommend follow up.

## 2014-10-22 NOTE — Telephone Encounter (Signed)
Patient is scheduled for 10/23/14

## 2014-10-22 NOTE — Telephone Encounter (Signed)
Can you please call patient and set up follow up visit.

## 2014-10-23 ENCOUNTER — Ambulatory Visit (INDEPENDENT_AMBULATORY_CARE_PROVIDER_SITE_OTHER)
Admission: RE | Admit: 2014-10-23 | Discharge: 2014-10-23 | Disposition: A | Discharge status: Home / Self Care | Payer: PRIVATE HEALTH INSURANCE | Source: Ambulatory Visit | Attending: Family Medicine | Admitting: Family Medicine

## 2014-10-23 ENCOUNTER — Ambulatory Visit (INDEPENDENT_AMBULATORY_CARE_PROVIDER_SITE_OTHER): Payer: PRIVATE HEALTH INSURANCE | Admitting: Family Medicine

## 2014-10-23 ENCOUNTER — Encounter: Payer: Self-pay | Admitting: Family Medicine

## 2014-10-23 VITALS — BP 126/68 | HR 67 | Temp 98.5°F | Wt 148.0 lb

## 2014-10-23 DIAGNOSIS — R058 Other specified cough: Secondary | ICD-10-CM

## 2014-10-23 DIAGNOSIS — J4530 Mild persistent asthma, uncomplicated: Secondary | ICD-10-CM

## 2014-10-23 DIAGNOSIS — R05 Cough: Secondary | ICD-10-CM

## 2014-10-23 MED ORDER — FLUTICASONE-SALMETEROL 100-50 MCG/DOSE IN AEPB: 1.0000 | INHALATION_SPRAY | Freq: Two times a day (BID) | RESPIRATORY_TRACT | Status: DC

## 2014-10-23 MED ORDER — LEVOFLOXACIN 500 MG PO TABS: 500.0000 mg | ORAL_TABLET | Freq: Every day | ORAL | Status: DC

## 2014-10-23 NOTE — Progress Notes (Signed)
Pre visit review using our clinic review tool, if applicable. No additional management support is needed unless otherwise documented below in the visit note. 

## 2014-10-23 NOTE — Progress Notes (Signed)
   Subjective:    Patient ID: Harold Benton, male    DOB: Oct 03, 1961, 53 y.o.   MRN: [id number]  HPI Patient seen with upper respiratory symptoms. He has history of asthma and has had recurrent sinusitis in the past. Back in September he had respiratory infection and went on antibiotic and then felt better until around the middle of November. He had recurrent sinusitis symptoms and was treated with Zithromax. He felt slightly better but over the past couple of weeks has had recurrence of cough, sinus congestion, intermittent headache, malaise, occasional chills without documented fever. He has occasional left lung pain but no consistent pleuritic symptoms and no hemoptysis.  He takes Advair once daily and has also been taking over-the-counter medications including Advil, decongestant, Mucinex. Never smoked. Runs for exercise. He does nasal saline irrigation daily. Patient is concerned because he states he's had about 4 or more respiratory infections per year and this seems to be increasing. He takes Singulair daily at baseline and is also has taken antihistamines and nasal steroids in the past. He is not aware of any clear allergens  Past Medical History  Diagnosis Date  . Testicular cancer 1984   Past Surgical History  Procedure Laterality Date  . Tecticle removal  1984  . Exploratory laparotomy  1984    reports that he has never smoked. He has never used smokeless tobacco. He reports that he drinks about 3.6 oz of alcohol per week. He reports that he does not use illicit drugs. family history includes Cancer in his father and mother. There is no history of Colon cancer or Stomach cancer. No Known Allergies    Review of Systems  Constitutional: Positive for chills and fatigue.  HENT: Positive for congestion and sinus pressure.   Respiratory: Positive for cough.   Neurological: Positive for headaches.       Objective:   Physical Exam  Constitutional: He appears well-developed and  well-nourished.  HENT:  Right Ear: External ear normal.  Left Ear: External ear normal.  Nose: Nose normal.  Neck: Neck supple.  Cardiovascular: Normal rate and regular rhythm.  Exam reveals no gallop.   No murmur heard. Pulmonary/Chest: Effort normal and breath sounds normal. No respiratory distress. He has no wheezes. He has no rales.  Lymphadenopathy:    He has no cervical adenopathy.          Assessment & Plan:  Recurrent cough/sinusitis symptoms in a patient with known reactive airway disease. Obtain chest x-ray. Start Levaquin 500 milligrams once daily for 7 days given duration of productive cough. Continue Mucinex. We discussed things like possible allergy testing and possible referral to pulmonologist. He wishes to assess chest x-ray results first. Continue Advair. He does not have evidence first of reactive airway exacerbation on exam today

## 2014-10-24 ENCOUNTER — Telehealth: Payer: Self-pay | Admitting: Family Medicine

## 2014-10-24 NOTE — Telephone Encounter (Signed)
Pt informed

## 2014-10-24 NOTE — Telephone Encounter (Signed)
Pt would like a call back to discuss xray results.

## 2015-04-12 ENCOUNTER — Other Ambulatory Visit (INDEPENDENT_AMBULATORY_CARE_PROVIDER_SITE_OTHER): Payer: PRIVATE HEALTH INSURANCE

## 2015-04-12 DIAGNOSIS — Z0000 Encounter for general adult medical examination without abnormal findings: Secondary | ICD-10-CM

## 2015-04-12 LAB — HEPATIC FUNCTION PANEL
ALK PHOS: 81 U/L (ref 39–117)
ALT: 30 U/L (ref 0–53)
AST: 37 U/L (ref 0–37)
Albumin: 4.4 g/dL (ref 3.5–5.2)
Bilirubin, Direct: 0.2 mg/dL (ref 0.0–0.3)
TOTAL PROTEIN: 6.2 g/dL (ref 6.0–8.3)
Total Bilirubin: 0.7 mg/dL (ref 0.2–1.2)

## 2015-04-12 LAB — BASIC METABOLIC PANEL
BUN: 25 mg/dL — AB (ref 6–23)
CALCIUM: 9.7 mg/dL (ref 8.4–10.5)
CO2: 30 meq/L (ref 19–32)
Chloride: 108 mEq/L (ref 96–112)
Creatinine, Ser: 1.04 mg/dL (ref 0.40–1.50)
GFR: 79 mL/min (ref >60.00)
Glucose, Bld: 87 mg/dL (ref 70–99)
POTASSIUM: 4.9 meq/L (ref 3.5–5.1)
SODIUM: 142 meq/L (ref 135–145)

## 2015-04-12 LAB — CBC WITH DIFFERENTIAL/PLATELET
BASOS ABS: 0 10*3/uL (ref 0.0–0.1)
Basophils Relative: 0.5 % (ref 0.0–3.0)
EOS ABS: 0 10*3/uL (ref 0.0–0.7)
EOS PCT: 0.6 % (ref 0.0–5.0)
HCT: 38.5 % — ABNORMAL LOW (ref 39.0–52.0)
HEMOGLOBIN: 12.7 g/dL — AB (ref 13.0–17.0)
LYMPHS ABS: 1.2 10*3/uL (ref 0.7–4.0)
LYMPHS PCT: 19.1 % (ref 12.0–46.0)
MCHC: 32.9 g/dL (ref 30.0–36.0)
MCV: 87.3 fl (ref 78.0–100.0)
MONO ABS: 0.6 10*3/uL (ref 0.1–1.0)
MONOS PCT: 9.6 % (ref 3.0–12.0)
NEUTROS PCT: 70.2 % (ref 43.0–77.0)
Neutro Abs: 4.4 10*3/uL (ref 1.4–7.7)
Platelets: 155 10*3/uL (ref 150.0–400.0)
RBC: 4.41 Mil/uL (ref 4.22–5.81)
RDW: 15.1 % (ref 11.5–15.5)
WBC: 6.3 10*3/uL (ref 4.0–10.5)

## 2015-04-12 LAB — LIPID PANEL
Cholesterol: 175 mg/dL (ref 0–200)
HDL: 51.9 mg/dL (ref >39.00)
LDL Cholesterol: 100 mg/dL — ABNORMAL HIGH (ref 0–99)
NonHDL: 123.1
Total CHOL/HDL Ratio: 3
Triglycerides: 114 mg/dL (ref 0.0–149.0)
VLDL: 22.8 mg/dL (ref 0.0–40.0)

## 2015-04-12 LAB — PSA: PSA: 0.18 ng/mL (ref 0.10–4.00)

## 2015-04-12 LAB — TSH: TSH: 1.6 u[IU]/mL (ref 0.35–4.50)

## 2015-04-19 ENCOUNTER — Encounter: Payer: Self-pay | Admitting: Family Medicine

## 2015-04-19 ENCOUNTER — Ambulatory Visit (INDEPENDENT_AMBULATORY_CARE_PROVIDER_SITE_OTHER): Payer: PRIVATE HEALTH INSURANCE | Admitting: Family Medicine

## 2015-04-19 VITALS — BP 120/80 | HR 48 | Temp 98.1°F | Ht 70.47 in | Wt 144.0 lb

## 2015-04-19 DIAGNOSIS — Z23 Encounter for immunization: Secondary | ICD-10-CM | POA: Diagnosis not present

## 2015-04-19 DIAGNOSIS — Z0000 Encounter for general adult medical examination without abnormal findings: Secondary | ICD-10-CM | POA: Diagnosis not present

## 2015-04-19 DIAGNOSIS — D649 Anemia, unspecified: Secondary | ICD-10-CM

## 2015-04-19 MED ORDER — SILDENAFIL CITRATE 100 MG PO TABS: 50.0000 mg | ORAL_TABLET | Freq: Every day | ORAL | Status: DC | PRN

## 2015-04-19 NOTE — Patient Instructions (Signed)
Return in approximately one month for repeat labs for follow-up CBC, iron level, and ferritin

## 2015-04-19 NOTE — Progress Notes (Signed)
Pre visit review using our clinic review tool, if applicable. No additional management support is needed unless otherwise documented below in the visit note. 

## 2015-04-19 NOTE — Progress Notes (Signed)
   Subjective:    Patient ID: Harold Benton, male    DOB: 1961/06/22, 54 y.o.   MRN: [id number]  HPI Patient here for complete physical. He has history of asthma and is currently off all regular medications. He stopped his Advair several months ago and no recent wheezing. He runs about 25 miles per week. Last tetanus over 10 years ago.  Had some recent left lateral elbow pain which he attributed to tenderness. Pain with gripping.  Had normal colonoscopy age 33. No recent appetite or weight changes. Nonsmoker.  Has had some issues with erectile dysfunction and requesting prescription for Viagra.  Past Medical History  Diagnosis Date  . Testicular cancer 1984   Past Surgical History  Procedure Laterality Date  . Tecticle removal  1984  . Exploratory laparotomy  1984    reports that he has never smoked. He has never used smokeless tobacco. He reports that he drinks about 3.6 oz of alcohol per week. He reports that he does not use illicit drugs. family history includes Cancer in his father and mother. There is no history of Colon cancer or Stomach cancer. No Known Allergies    Review of Systems  Constitutional: Negative for fever, activity change, appetite change, fatigue and unexpected weight change.  HENT: Negative for congestion, ear pain and trouble swallowing.   Eyes: Negative for pain and visual disturbance.  Respiratory: Negative for cough, shortness of breath and wheezing.   Cardiovascular: Negative for chest pain and palpitations.  Gastrointestinal: Negative for nausea, vomiting, abdominal pain, diarrhea, constipation, blood in stool, abdominal distention and rectal pain.  Endocrine: Negative for polydipsia and polyuria.  Genitourinary: Negative for dysuria, hematuria and testicular pain.  Musculoskeletal: Negative for joint swelling and arthralgias.  Skin: Negative for rash.  Neurological: Negative for dizziness, syncope and headaches.  Hematological: Negative for  adenopathy.  Psychiatric/Behavioral: Negative for confusion and dysphoric mood.       Objective:   Physical Exam  Constitutional: He is oriented to person, place, and time. He appears well-developed and well-nourished. No distress.  HENT:  Head: Normocephalic and atraumatic.  Right Ear: External ear normal.  Left Ear: External ear normal.  Mouth/Throat: Oropharynx is clear and moist.  Eyes: Conjunctivae and EOM are normal. Pupils are equal, round, and reactive to light.  Neck: Normal range of motion. Neck supple. No thyromegaly present.  Cardiovascular: Normal rate, regular rhythm and normal heart sounds.   No murmur heard. Pulmonary/Chest: No respiratory distress. He has no wheezes. He has no rales.  Abdominal: Soft. Bowel sounds are normal. He exhibits no distension and no mass. There is no tenderness. There is no rebound and no guarding.  Musculoskeletal: He exhibits no edema.  Lymphadenopathy:    He has no cervical adenopathy.  Neurological: He is alert and oriented to person, place, and time. He displays normal reflexes. No cranial nerve deficit.  Skin: No rash noted.  Psychiatric: He has a normal mood and affect.          Assessment & Plan:  Complete physical. Labs reviewed. He has mildly low hemoglobin 12.7 with normal MCV-? Significance. No worrisome symptoms. Other labs unremarkable. Tetanus booster given. Repeat CBC in one month and include serum  iron, TIBC, ferritin

## 2015-07-29 ENCOUNTER — Encounter: Payer: Self-pay | Admitting: Family Medicine

## 2015-07-31 ENCOUNTER — Other Ambulatory Visit (INDEPENDENT_AMBULATORY_CARE_PROVIDER_SITE_OTHER): Payer: PRIVATE HEALTH INSURANCE

## 2015-07-31 DIAGNOSIS — D649 Anemia, unspecified: Secondary | ICD-10-CM

## 2015-07-31 LAB — CBC WITH DIFFERENTIAL/PLATELET
BASOS PCT: 0.7 % (ref 0.0–3.0)
Basophils Absolute: 0 10*3/uL (ref 0.0–0.1)
Eosinophils Absolute: 0 10*3/uL (ref 0.0–0.7)
Eosinophils Relative: 0.8 % (ref 0.0–5.0)
HEMATOCRIT: 38.6 % — AB (ref 39.0–52.0)
HEMOGLOBIN: 12.7 g/dL — AB (ref 13.0–17.0)
LYMPHS PCT: 21.4 % (ref 12.0–46.0)
Lymphs Abs: 0.9 10*3/uL (ref 0.7–4.0)
MCHC: 32.9 g/dL (ref 30.0–36.0)
MCV: 88.8 fl (ref 78.0–100.0)
MONOS PCT: 9.2 % (ref 3.0–12.0)
Monocytes Absolute: 0.4 10*3/uL (ref 0.1–1.0)
Neutro Abs: 2.9 10*3/uL (ref 1.4–7.7)
Neutrophils Relative %: 67.9 % (ref 43.0–77.0)
Platelets: 132 10*3/uL — ABNORMAL LOW (ref 150.0–400.0)
RBC: 4.35 Mil/uL (ref 4.22–5.81)
RDW: 14.1 % (ref 11.5–15.5)
WBC: 4.3 10*3/uL (ref 4.0–10.5)

## 2015-07-31 LAB — FERRITIN: Ferritin: 10.3 ng/mL — ABNORMAL LOW (ref 22.0–322.0)

## 2015-08-01 ENCOUNTER — Other Ambulatory Visit: Payer: Self-pay | Admitting: Family Medicine

## 2015-08-01 ENCOUNTER — Encounter: Payer: Self-pay | Admitting: Family Medicine

## 2015-08-01 DIAGNOSIS — D509 Iron deficiency anemia, unspecified: Secondary | ICD-10-CM

## 2015-08-01 LAB — IRON AND TIBC
%SAT: 25 % (ref 15–60)
Iron: 124 ug/dL (ref 50–180)
TIBC: 496 ug/dL — AB (ref 250–425)
UIBC: 372 ug/dL (ref 125–400)

## 2015-09-06 ENCOUNTER — Other Ambulatory Visit (INDEPENDENT_AMBULATORY_CARE_PROVIDER_SITE_OTHER): Payer: PRIVATE HEALTH INSURANCE

## 2015-09-06 DIAGNOSIS — K921 Melena: Secondary | ICD-10-CM

## 2015-09-06 LAB — POC HEMOCCULT BLD/STL (HOME/3-CARD/SCREEN)
Card #2 Fecal Occult Blod, POC: NEGATIVE
FECAL OCCULT BLD: NEGATIVE
FECAL OCCULT BLD: NEGATIVE

## 2015-09-09 ENCOUNTER — Encounter: Payer: Self-pay | Admitting: Family Medicine

## 2015-09-09 DIAGNOSIS — D509 Iron deficiency anemia, unspecified: Secondary | ICD-10-CM

## 2015-09-10 ENCOUNTER — Other Ambulatory Visit (INDEPENDENT_AMBULATORY_CARE_PROVIDER_SITE_OTHER): Payer: PRIVATE HEALTH INSURANCE

## 2015-09-10 DIAGNOSIS — D509 Iron deficiency anemia, unspecified: Secondary | ICD-10-CM | POA: Diagnosis not present

## 2015-09-10 LAB — CBC WITH DIFFERENTIAL/PLATELET
BASOS PCT: 0.5 % (ref 0.0–3.0)
Basophils Absolute: 0 10*3/uL (ref 0.0–0.1)
EOS PCT: 0.9 % (ref 0.0–5.0)
Eosinophils Absolute: 0.1 10*3/uL (ref 0.0–0.7)
HEMATOCRIT: 41.2 % (ref 39.0–52.0)
HEMOGLOBIN: 13.4 g/dL (ref 13.0–17.0)
Lymphocytes Relative: 19.2 % (ref 12.0–46.0)
Lymphs Abs: 1.2 10*3/uL (ref 0.7–4.0)
MCHC: 32.5 g/dL (ref 30.0–36.0)
MCV: 90.5 fl (ref 78.0–100.0)
MONO ABS: 0.7 10*3/uL (ref 0.1–1.0)
MONOS PCT: 11.1 % (ref 3.0–12.0)
Neutro Abs: 4.3 10*3/uL (ref 1.4–7.7)
Neutrophils Relative %: 68.3 % (ref 43.0–77.0)
Platelets: 157 10*3/uL (ref 150.0–400.0)
RBC: 4.55 Mil/uL (ref 4.22–5.81)
RDW: 15.4 % (ref 11.5–15.5)
WBC: 6.3 10*3/uL (ref 4.0–10.5)

## 2015-09-10 LAB — FERRITIN: FERRITIN: 25.1 ng/mL (ref 22.0–322.0)

## 2015-09-25 ENCOUNTER — Encounter: Payer: Self-pay | Admitting: Family Medicine

## 2015-09-25 MED ORDER — AZITHROMYCIN 250 MG PO TABS: ORAL_TABLET | ORAL | Status: DC

## 2016-04-22 ENCOUNTER — Other Ambulatory Visit: Payer: Self-pay | Admitting: Family Medicine

## 2016-10-02 ENCOUNTER — Ambulatory Visit (INDEPENDENT_AMBULATORY_CARE_PROVIDER_SITE_OTHER): Payer: PRIVATE HEALTH INSURANCE

## 2016-10-02 DIAGNOSIS — Z23 Encounter for immunization: Secondary | ICD-10-CM | POA: Diagnosis not present

## 2016-10-09 ENCOUNTER — Other Ambulatory Visit (INDEPENDENT_AMBULATORY_CARE_PROVIDER_SITE_OTHER): Payer: PRIVATE HEALTH INSURANCE

## 2016-10-09 DIAGNOSIS — Z0000 Encounter for general adult medical examination without abnormal findings: Secondary | ICD-10-CM

## 2016-10-09 LAB — CBC WITH DIFFERENTIAL/PLATELET
BASOS PCT: 0.4 % (ref 0.0–3.0)
Basophils Absolute: 0 10*3/uL (ref 0.0–0.1)
Eosinophils Absolute: 0 10*3/uL (ref 0.0–0.7)
Eosinophils Relative: 0.8 % (ref 0.0–5.0)
HEMATOCRIT: 39.3 % (ref 39.0–52.0)
Hemoglobin: 13 g/dL (ref 13.0–17.0)
LYMPHS ABS: 1.4 10*3/uL (ref 0.7–4.0)
LYMPHS PCT: 26 % (ref 12.0–46.0)
MCHC: 33.1 g/dL (ref 30.0–36.0)
MCV: 89.4 fl (ref 78.0–100.0)
Monocytes Absolute: 0.4 10*3/uL (ref 0.1–1.0)
Monocytes Relative: 8.3 % (ref 3.0–12.0)
NEUTROS ABS: 3.4 10*3/uL (ref 1.4–7.7)
Neutrophils Relative %: 64.5 % (ref 43.0–77.0)
PLATELETS: 144 10*3/uL — AB (ref 150.0–400.0)
RBC: 4.4 Mil/uL (ref 4.22–5.81)
RDW: 14.5 % (ref 11.5–15.5)
WBC: 5.3 10*3/uL (ref 4.0–10.5)

## 2016-10-09 LAB — PSA: PSA: 0.23 ng/mL (ref 0.10–4.00)

## 2016-10-09 LAB — LIPID PANEL
CHOL/HDL RATIO: 3
Cholesterol: 167 mg/dL (ref 0–200)
HDL: 55.6 mg/dL (ref >39.00)
LDL Cholesterol: 93 mg/dL (ref 0–99)
NonHDL: 111.42
TRIGLYCERIDES: 91 mg/dL (ref 0.0–149.0)
VLDL: 18.2 mg/dL (ref 0.0–40.0)

## 2016-10-09 LAB — HEPATIC FUNCTION PANEL
ALBUMIN: 4.4 g/dL (ref 3.5–5.2)
ALT: 23 U/L (ref 0–53)
AST: 23 U/L (ref 0–37)
Alkaline Phosphatase: 74 U/L (ref 39–117)
BILIRUBIN DIRECT: 0.1 mg/dL (ref 0.0–0.3)
TOTAL PROTEIN: 6.2 g/dL (ref 6.0–8.3)
Total Bilirubin: 0.6 mg/dL (ref 0.2–1.2)

## 2016-10-09 LAB — BASIC METABOLIC PANEL
BUN: 28 mg/dL — AB (ref 6–23)
CHLORIDE: 106 meq/L (ref 96–112)
CO2: 30 meq/L (ref 19–32)
Calcium: 9.5 mg/dL (ref 8.4–10.5)
Creatinine, Ser: 1.06 mg/dL (ref 0.40–1.50)
GFR: 76.86 mL/min (ref >60.00)
GLUCOSE: 93 mg/dL (ref 70–99)
Potassium: 4.7 mEq/L (ref 3.5–5.1)
Sodium: 141 mEq/L (ref 135–145)

## 2016-10-09 LAB — TSH: TSH: 1.66 u[IU]/mL (ref 0.35–4.50)

## 2016-10-16 ENCOUNTER — Ambulatory Visit (INDEPENDENT_AMBULATORY_CARE_PROVIDER_SITE_OTHER): Payer: PRIVATE HEALTH INSURANCE | Admitting: Family Medicine

## 2016-10-16 ENCOUNTER — Encounter: Payer: Self-pay | Admitting: Family Medicine

## 2016-10-16 VITALS — BP 116/72 | HR 56 | Temp 98.1°F | Ht 70.0 in | Wt 145.1 lb

## 2016-10-16 DIAGNOSIS — Z0000 Encounter for general adult medical examination without abnormal findings: Secondary | ICD-10-CM | POA: Diagnosis not present

## 2016-10-16 MED ORDER — SILDENAFIL CITRATE 20 MG PO TABS: ORAL_TABLET | ORAL | 6 refills | Status: DC

## 2016-10-16 NOTE — Progress Notes (Signed)
Subjective:     Patient ID: Harold Benton, male   DOB: Jul 05, 1961, 55 y.o.   MRN: [id number]  HPI Patient seen for complete physical. He runs about 20-25 miles per week. Job requires frequent travel. Currently takes no regular medications. He uses Viagra as needed for erectile dysfunction. Never smoked. No regular alcohol.  Mother died age 72 of metastatic cancer. He is not sure of primary. Father died age 39. He had chronic lymphocytic leukemia.  Generally patient feels well. Immunizations up-to-date. Colonoscopy up-to-date. No specific risk factors for hepatitis C  Past Medical History:  Diagnosis Date  . Testicular cancer ([place]) 1984   Past Surgical History:  Procedure Laterality Date  . EXPLORATORY LAPAROTOMY  1984  . tecticle removal  1984    reports that he has never smoked. He has never used smokeless tobacco. He reports that he drinks about 3.6 oz of alcohol per week . He reports that he does not use drugs. family history includes Cancer in his father and mother. No Known Allergies   Review of Systems  Constitutional: Negative for activity change, appetite change, fatigue and fever.  HENT: Negative for congestion, ear pain and trouble swallowing.   Eyes: Negative for pain and visual disturbance.  Respiratory: Negative for cough, shortness of breath and wheezing.   Cardiovascular: Negative for chest pain and palpitations.  Gastrointestinal: Negative for abdominal distention, abdominal pain, blood in stool, constipation, diarrhea, nausea, rectal pain and vomiting.  Genitourinary: Negative for dysuria, hematuria and testicular pain.  Musculoskeletal: Negative for arthralgias and joint swelling.  Skin: Negative for rash.  Neurological: Negative for dizziness, syncope and headaches.  Hematological: Negative for adenopathy.  Psychiatric/Behavioral: Negative for confusion and dysphoric mood.       Objective:   Physical Exam  Constitutional: He is oriented to person, place,  and time. He appears well-developed and well-nourished. No distress.  HENT:  Head: Normocephalic and atraumatic.  Right Ear: External ear normal.  Left Ear: External ear normal.  Mouth/Throat: Oropharynx is clear and moist. No oropharyngeal exudate.  Eyes: Conjunctivae and EOM are normal. Pupils are equal, round, and reactive to light.  Neck: Normal range of motion. Neck supple. No thyromegaly present.  Cardiovascular: Normal rate, regular rhythm and normal heart sounds.   No murmur heard. Pulmonary/Chest: No respiratory distress. He has no wheezes. He has no rales.  Abdominal: Soft. Bowel sounds are normal. He exhibits no distension and no mass. There is no tenderness. There is no rebound and no guarding.  Musculoskeletal: He exhibits no edema.  Lymphadenopathy:    He has no cervical adenopathy.  Neurological: He is alert and oriented to person, place, and time. He displays normal reflexes. No cranial nerve deficit.  Skin: No rash noted.  Psychiatric: He has a normal mood and affect.       Assessment:     Complete physical. Labs reviewed with no major concerns    Plan:     -continue with regular exercise habits -Continue with yearly flu vaccine -Repeat colonoscopy in 5 years  Eulas Post MD [facility] at [facility]

## 2016-11-27 ENCOUNTER — Encounter: Payer: Self-pay | Admitting: Family Medicine

## 2017-07-22 ENCOUNTER — Encounter: Payer: Self-pay | Admitting: Family Medicine

## 2017-08-01 ENCOUNTER — Encounter: Payer: Self-pay | Admitting: Family Medicine

## 2017-08-02 MED ORDER — VALACYCLOVIR HCL 1 G PO TABS: 1000.0000 mg | ORAL_TABLET | Freq: Three times a day (TID) | ORAL | 0 refills | Status: AC

## 2017-08-19 ENCOUNTER — Encounter: Payer: Self-pay | Admitting: Family Medicine

## 2017-08-20 ENCOUNTER — Encounter: Payer: Self-pay | Admitting: *Deleted

## 2017-08-20 ENCOUNTER — Encounter: Payer: Self-pay | Admitting: Family Medicine

## 2017-08-20 ENCOUNTER — Ambulatory Visit (INDEPENDENT_AMBULATORY_CARE_PROVIDER_SITE_OTHER): Payer: PRIVATE HEALTH INSURANCE | Admitting: Family Medicine

## 2017-08-20 ENCOUNTER — Telehealth: Payer: Self-pay | Admitting: *Deleted

## 2017-08-20 VITALS — BP 140/80 | HR 49 | Temp 98.3°F | Wt 147.8 lb

## 2017-08-20 DIAGNOSIS — Z23 Encounter for immunization: Secondary | ICD-10-CM | POA: Diagnosis not present

## 2017-08-20 DIAGNOSIS — B028 Zoster with other complications: Secondary | ICD-10-CM | POA: Diagnosis not present

## 2017-08-20 MED ORDER — GABAPENTIN 100 MG PO CAPS: ORAL_CAPSULE | ORAL | 3 refills | Status: DC

## 2017-08-20 NOTE — Patient Instructions (Signed)
Postherpetic Neuralgia Postherpetic neuralgia (PHN) is nerve pain that occurs after a shingles infection. Shingles is a painful rash that appears on one side of the body, usually on your trunk or face. Shingles is caused by the varicella-zoster virus. This is the same virus that causes chickenpox. In people who have had chickenpox, the virus can resurface years later and cause shingles. You may have PHN if you continue to have pain for 3 months after your shingles rash has gone away. PHN appears in the same area where you had the shingles rash. For most people, PHN goes away within 1 year. Getting a vaccination for shingles can prevent PHN. This vaccine is recommended for people older than 50. It may prevent shingles and may also lower your risk of PHN if you do get shingles. What are the causes? PHN is caused by damage to your nerves from the varicella-zoster virus. This damage makes your nerves overly sensitive. What increases the risk? Aging is the biggest risk factor for developing PHN. Most people who get PHN are older than 60. Other risk factors include:  Having very bad pain before your shingles rash starts.  Having a very bad rash.  Having shingles in the nerve that supplies your face and eye (trigeminal nerve).  What are the signs or symptoms? Pain is the main symptom of PHN. The pain is often very bad and may be described as stabbing, burning, or feeling like an electric shock. The pain may come and go or may be there all the time. Pain may be triggered by light touches on the skin or changes in temperature. You may have itching along with the pain. How is this diagnosed? Your health care provider may diagnose PHN based on your symptoms and your history of shingles. Lab studies and other diagnostic tests are usually not needed. How is this treated? There is no cure for PHN. Treatment for PHN will focus on pain relief. Over-the-counter pain relievers do not usually relieve PHN pain. You  may need to work with a pain specialist. Treatment may include:  Antidepressant medicines to help with pain and improve sleep.  Antiseizure medicines to relieve nerve pain.  Strong pain relievers (opioids).  A numbing patch worn on the skin (lidocaine patch).  Follow these instructions at home: It may take a long time to recover from PHN. Work closely with your health care provider, and have a good support system at home.  Take all medicines as directed by your health care provider.  Wear loose, comfortable clothing.  Cover sensitive areas with a dressing to reduce friction from clothing rubbing on the area.  If cold does not make your pain worse, try applying a cool compress or cooling gel pack to the area.  Talk to your health care provider if you feel depressed or desperate. Living with long-term pain can be depressing.  Contact a health care provider if:  Your medicine is not helping.  You are struggling to manage your pain at home. This information is not intended to replace advice given to you by your health care provider. Make sure you discuss any questions you have with your health care provider. Document Released: 01/09/2003 Document Revised: 03/26/2016 Document Reviewed: 10/10/2013 Elsevier Interactive Patient Education  2018 [employer] Inc[employer]  

## 2017-08-20 NOTE — Telephone Encounter (Signed)
Open in error

## 2017-08-20 NOTE — Progress Notes (Signed)
Subjective:     Patient ID: Harold Benton, male   DOB: June 03, 1961, 56 y.o.   MRN: [id number]  HPI Patient seen with blistery lesion a few weeks ago left anterior axillary region and with several discrete scattered lesions following dermatome into his right arm and forearm and even hand. Now presents with some numbness involving the right fifth digit and fourth digit. He has burning dysesthesias of the right upper extremity especially at night. Daytime pain is about 3 out of 10. Pain interfering some with sleep. Has taken Advil with minimal relief. No neck pain.  He wants to look at other possible treatments for his night pain.  Past Medical History:  Diagnosis Date  . Testicular cancer ([place]) 1984   Past Surgical History:  Procedure Laterality Date  . EXPLORATORY LAPAROTOMY  1984  . tecticle removal  1984    reports that he has never smoked. He has never used smokeless tobacco. He reports that he drinks about 3.6 oz of alcohol per week . He reports that he does not use drugs. family history includes Cancer in his father and mother. No Known Allergies   Review of Systems  Constitutional: Negative for chills and fever.  Neurological: Positive for numbness. Negative for weakness.  Hematological: Negative for adenopathy.       Objective:   Physical Exam  Constitutional: He appears well-developed and well-nourished.  Cardiovascular: Normal rate and regular rhythm.   Neurological:  Full strength left and right upper extremity. Subjective decrease sensory touch right fifth digit.  No muscle atrophy.  Symmetric reflexes.  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 Not relieved with 300 mg gabapentin -suspect his partial  numbness in ulnar distribution will clear in next several weeks to months. -follow up for any weakness.  Eulas Post MD [facility] at [facility]

## 2017-12-10 ENCOUNTER — Other Ambulatory Visit: Payer: PRIVATE HEALTH INSURANCE

## 2017-12-10 ENCOUNTER — Ambulatory Visit (INDEPENDENT_AMBULATORY_CARE_PROVIDER_SITE_OTHER): Payer: PRIVATE HEALTH INSURANCE | Admitting: Family Medicine

## 2017-12-10 ENCOUNTER — Encounter: Payer: Self-pay | Admitting: Family Medicine

## 2017-12-10 VITALS — BP 110/70 | HR 58 | Temp 99.2°F | Ht 71.0 in | Wt 147.3 lb

## 2017-12-10 DIAGNOSIS — Z0000 Encounter for general adult medical examination without abnormal findings: Secondary | ICD-10-CM | POA: Diagnosis not present

## 2017-12-10 DIAGNOSIS — Z8547 Personal history of malignant neoplasm of testis: Secondary | ICD-10-CM

## 2017-12-10 LAB — BASIC METABOLIC PANEL
BUN: 18 mg/dL (ref 6–23)
CO2: 31 meq/L (ref 19–32)
Calcium: 9.8 mg/dL (ref 8.4–10.5)
Chloride: 99 mEq/L (ref 96–112)
Creatinine, Ser: 0.93 mg/dL (ref 0.40–1.50)
GFR: 89.01 mL/min (ref >60.00)
GLUCOSE: 89 mg/dL (ref 70–99)
POTASSIUM: 3.9 meq/L (ref 3.5–5.1)
SODIUM: 138 meq/L (ref 135–145)

## 2017-12-10 LAB — HEPATIC FUNCTION PANEL
ALBUMIN: 4.9 g/dL (ref 3.5–5.2)
ALT: 22 U/L (ref 0–53)
AST: 24 U/L (ref 0–37)
Alkaline Phosphatase: 83 U/L (ref 39–117)
Bilirubin, Direct: 0.3 mg/dL (ref 0.0–0.3)
Total Bilirubin: 1.4 mg/dL — ABNORMAL HIGH (ref 0.2–1.2)
Total Protein: 7.3 g/dL (ref 6.0–8.3)

## 2017-12-10 LAB — CBC WITH DIFFERENTIAL/PLATELET
BASOS ABS: 54 {cells}/uL (ref 0–200)
BASOS PCT: 0.5 %
EOS ABS: 43 {cells}/uL (ref 15–500)
Eosinophils Relative: 0.4 %
HCT: 39.5 % (ref 38.5–50.0)
HEMOGLOBIN: 13.4 g/dL (ref 13.2–17.1)
Lymphs Abs: 2279 cells/uL (ref 850–3900)
MCH: 28.6 pg (ref 27.0–33.0)
MCHC: 33.9 g/dL (ref 32.0–36.0)
MCV: 84.2 fL (ref 80.0–100.0)
MONOS PCT: 6.9 %
MPV: 11.6 fL (ref 7.5–12.5)
NEUTROS ABS: 7679 {cells}/uL (ref 1500–7800)
Neutrophils Relative %: 71.1 %
PLATELETS: 169 10*3/uL (ref 140–400)
RBC: 4.69 10*6/uL (ref 4.20–5.80)
RDW: 13.1 % (ref 11.0–15.0)
TOTAL LYMPHOCYTE: 21.1 %
WBC mixed population: 745 cells/uL (ref 200–950)
WBC: 10.8 10*3/uL (ref 3.8–10.8)

## 2017-12-10 LAB — LIPID PANEL
Cholesterol: 194 mg/dL (ref 0–200)
HDL: 58.3 mg/dL (ref >39.00)
LDL CALC: 114 mg/dL — AB (ref 0–99)
NONHDL: 135.9
Total CHOL/HDL Ratio: 3
Triglycerides: 110 mg/dL (ref 0.0–149.0)
VLDL: 22 mg/dL (ref 0.0–40.0)

## 2017-12-10 LAB — TSH: TSH: 1.42 u[IU]/mL (ref 0.35–4.50)

## 2017-12-10 LAB — PSA: PSA: 0.56 ng/mL (ref 0.10–4.00)

## 2017-12-10 MED ORDER — AMOXICILLIN-POT CLAVULANATE 875-125 MG PO TABS: 1.0000 | ORAL_TABLET | Freq: Two times a day (BID) | ORAL | 0 refills | Status: DC

## 2017-12-10 NOTE — Progress Notes (Signed)
Subjective:     Patient ID: Harold Benton, male   DOB: 03/26/1961, 57 y.o.   MRN: [id number]  HPI Patient seen for physical exam. Generally very healthy. He had testicular cancer several years ago. He is dealing with recurrent sinusitis symptoms. Was treated back in November. Current symptoms started about a week ago. He has facial pain and headache and nasal congestion along with sore throat.  Colonoscopy up-to-date. Immunizations up-to-date. No history of hepatitis C screening. Low risk.  Past Medical History:  Diagnosis Date  . Testicular cancer ([place]) 1984   Past Surgical History:  Procedure Laterality Date  . EXPLORATORY LAPAROTOMY  1984  . tecticle removal  1984    reports that  has never smoked. he has never used smokeless tobacco. He reports that he drinks about 3.6 oz of alcohol per week. He reports that he does not use drugs. family history includes Cancer in his father and mother. No Known Allergies   Review of Systems  Constitutional: Positive for fatigue. Negative for activity change, appetite change and fever.  HENT: Positive for congestion, sinus pressure and sinus pain. Negative for ear pain and trouble swallowing.   Eyes: Negative for pain and visual disturbance.  Respiratory: Negative for cough, shortness of breath and wheezing.   Cardiovascular: Negative for chest pain and palpitations.  Gastrointestinal: Negative for abdominal distention, abdominal pain, blood in stool, constipation, diarrhea, nausea, rectal pain and vomiting.  Genitourinary: Negative for dysuria, hematuria and testicular pain.  Musculoskeletal: Negative for arthralgias and joint swelling.  Skin: Negative for rash.  Neurological: Positive for headaches. Negative for dizziness and syncope.  Hematological: Negative for adenopathy.  Psychiatric/Behavioral: Negative for confusion and dysphoric mood.       Objective:   Physical Exam  Constitutional: He is oriented to person, place, and time. He  appears well-developed and well-nourished. No distress.  HENT:  Head: Normocephalic and atraumatic.  Right Ear: External ear normal.  Left Ear: External ear normal.  Mouth/Throat: Oropharynx is clear and moist.  Eyes: Conjunctivae and EOM are normal. Pupils are equal, round, and reactive to light.  Neck: Normal range of motion. Neck supple. No thyromegaly present.  Cardiovascular: Normal rate, regular rhythm and normal heart sounds.  No murmur heard. Pulmonary/Chest: No respiratory distress. He has no wheezes. He has no rales.  Abdominal: Soft. Bowel sounds are normal. He exhibits no distension and no mass. There is no tenderness. There is no rebound and no guarding.  Musculoskeletal: He exhibits no edema.  Lymphadenopathy:    He has no cervical adenopathy.  Neurological: He is alert and oriented to person, place, and time. He displays normal reflexes. No cranial nerve deficit.  Skin: No rash noted.  Psychiatric: He has a normal mood and affect.       Assessment:     Physical exam. Several issues addressed as below.  He is also describing recurrent sinusitis symptoms.  Hx of recurrent sinusitis in past- frequently lasting several weeks duration.      Plan:     -we discussed shingles vaccine but he declines at this point as he had clinical case of shingles in the past year. Will consider shingles vaccine in the next couple of years -Obtain screening lab work including hepatitis C antibody -The natural history of prostate cancer and ongoing controversy regarding screening and potential treatment outcomes of prostate cancer has been discussed with the patient. The meaning of a false positive PSA and a false negative PSA has been discussed. He indicates  understanding of the limitations of this screening test and wishes to proceed with screening PSA testing. -Augmentin 875 mg bid for 10 days.  Eulas Post MD [facility] at [facility]

## 2017-12-11 ENCOUNTER — Encounter: Payer: Self-pay | Admitting: Family Medicine

## 2017-12-11 DIAGNOSIS — Z8547 Personal history of malignant neoplasm of testis: Secondary | ICD-10-CM | POA: Insufficient documentation

## 2017-12-11 LAB — HEPATITIS C ANTIBODY
HEP C AB: NONREACTIVE
SIGNAL TO CUT-OFF: 0 (ref <1.00)

## 2018-01-21 ENCOUNTER — Other Ambulatory Visit: Payer: Self-pay | Admitting: Family Medicine

## 2018-01-21 NOTE — Telephone Encounter (Signed)
Copied from [place] [id number]. Topic: Quick Communication - Rx Refill/Question >> Jan 21, 2018 11:32 AM Aurelio Brash B wrote: Medication:sildenafil (REVATIO) 20 MG tablet  Has the patient contacted their pharmacy? Yes   (Agent: If no, request that the patient contact the pharmacy for the refill.)  Preferred Pharmacy (with phone number or street name): [facility] [address] [place] - [place] [phone] (Phone) [phone] (Fax)     Agent: Please be advised that RX refills may take up to 3 business days. We ask that you follow-up with your pharmacy.

## 2018-10-27 ENCOUNTER — Encounter: Payer: Self-pay | Admitting: Family Medicine

## 2018-10-28 ENCOUNTER — Other Ambulatory Visit: Payer: Self-pay

## 2018-10-28 MED ORDER — AMOXICILLIN-POT CLAVULANATE 875-125 MG PO TABS: 1.0000 | ORAL_TABLET | Freq: Two times a day (BID) | ORAL | 0 refills | Status: DC

## 2019-07-15 ENCOUNTER — Other Ambulatory Visit: Payer: Self-pay | Admitting: Family Medicine

## 2019-09-19 ENCOUNTER — Ambulatory Visit (INDEPENDENT_AMBULATORY_CARE_PROVIDER_SITE_OTHER): Payer: PRIVATE HEALTH INSURANCE

## 2019-09-19 ENCOUNTER — Other Ambulatory Visit: Payer: Self-pay

## 2019-09-19 DIAGNOSIS — Z23 Encounter for immunization: Secondary | ICD-10-CM

## 2019-10-09 ENCOUNTER — Other Ambulatory Visit: Payer: Self-pay

## 2019-10-09 ENCOUNTER — Ambulatory Visit (INDEPENDENT_AMBULATORY_CARE_PROVIDER_SITE_OTHER): Payer: PRIVATE HEALTH INSURANCE | Admitting: Family Medicine

## 2019-10-09 ENCOUNTER — Encounter: Payer: Self-pay | Admitting: Family Medicine

## 2019-10-09 VITALS — BP 110/62 | HR 50 | Temp 97.6°F | Ht 71.0 in | Wt 146.0 lb

## 2019-10-09 DIAGNOSIS — H00014 Hordeolum externum left upper eyelid: Secondary | ICD-10-CM

## 2019-10-09 MED ORDER — ERYTHROMYCIN 5 MG/GM OP OINT: 1.0000 "application " | TOPICAL_OINTMENT | Freq: Four times a day (QID) | OPHTHALMIC | 0 refills | Status: DC

## 2019-10-09 NOTE — Progress Notes (Signed)
   Subjective:    Patient ID: Harold Benton, male    DOB: 19-Jan-1961, 58 y.o.   MRN: [id number]  HPI Here for a painful stye on the left upper eyelid for the past 5 days. Hot compresses have not helped.    Review of Systems  Constitutional: Negative.   Respiratory: Negative.   Cardiovascular: Negative.        Objective:   Physical Exam Constitutional:      General: He is not in acute distress.    Appearance: Normal appearance.  Eyes:     Conjunctiva/sclera: Conjunctivae normal.     Comments: Left upper eyelid has a tender stye along the lash line  Cardiovascular:     Rate and Rhythm: Normal rate and regular rhythm.     Pulses: Normal pulses.     Heart sounds: Normal heart sounds.  Pulmonary:     Effort: Pulmonary effort is normal.     Breath sounds: Normal breath sounds.  Lymphadenopathy:     Cervical: No cervical adenopathy.  Neurological:     Mental Status: He is alert.           Assessment & Plan:  Stye. This was opened with tthe tip of a sterile needle and the contents were expressed. He wil continue the compresses and add Erythromycin ointment QID as needed. Alysia Penna, MD

## 2019-11-27 ENCOUNTER — Ambulatory Visit: Payer: PRIVATE HEALTH INSURANCE | Attending: Internal Medicine

## 2019-11-29 ENCOUNTER — Ambulatory Visit: Payer: PRIVATE HEALTH INSURANCE | Attending: Internal Medicine

## 2019-11-29 DIAGNOSIS — Z20822 Contact with and (suspected) exposure to covid-19: Secondary | ICD-10-CM

## 2019-11-30 LAB — NOVEL CORONAVIRUS, NAA: SARS-CoV-2, NAA: NOT DETECTED

## 2019-12-16 ENCOUNTER — Ambulatory Visit: Payer: PRIVATE HEALTH INSURANCE

## 2020-01-03 ENCOUNTER — Ambulatory Visit: Payer: PRIVATE HEALTH INSURANCE | Attending: Internal Medicine

## 2020-01-03 DIAGNOSIS — Z20822 Contact with and (suspected) exposure to covid-19: Secondary | ICD-10-CM

## 2020-01-04 LAB — NOVEL CORONAVIRUS, NAA: SARS-CoV-2, NAA: NOT DETECTED

## 2020-07-10 ENCOUNTER — Other Ambulatory Visit: Payer: Self-pay | Admitting: Critical Care Medicine

## 2020-07-10 ENCOUNTER — Other Ambulatory Visit: Payer: PRIVATE HEALTH INSURANCE

## 2020-07-10 DIAGNOSIS — Z20822 Contact with and (suspected) exposure to covid-19: Secondary | ICD-10-CM

## 2020-07-12 LAB — SARS-COV-2, NAA 2 DAY TAT

## 2020-07-12 LAB — NOVEL CORONAVIRUS, NAA: SARS-CoV-2, NAA: NOT DETECTED

## 2020-07-22 ENCOUNTER — Other Ambulatory Visit: Payer: Self-pay

## 2020-07-22 ENCOUNTER — Ambulatory Visit (INDEPENDENT_AMBULATORY_CARE_PROVIDER_SITE_OTHER): Payer: PRIVATE HEALTH INSURANCE | Admitting: Family Medicine

## 2020-07-22 ENCOUNTER — Encounter: Payer: Self-pay | Admitting: Family Medicine

## 2020-07-22 VITALS — BP 120/70 | HR 48 | Temp 97.8°F | Ht 71.0 in | Wt 143.0 lb

## 2020-07-22 DIAGNOSIS — Z23 Encounter for immunization: Secondary | ICD-10-CM

## 2020-07-22 DIAGNOSIS — Z0000 Encounter for general adult medical examination without abnormal findings: Secondary | ICD-10-CM

## 2020-07-22 DIAGNOSIS — S76302A Unspecified injury of muscle, fascia and tendon of the posterior muscle group at thigh level, left thigh, initial encounter: Secondary | ICD-10-CM

## 2020-07-22 NOTE — Progress Notes (Signed)
Established Patient Office Visit  Subjective:  Patient ID: Harold Benton, male    DOB: 09/11/1961  Age: 59 y.o. MRN: [id number]  CC:  Chief Complaint  Patient presents with  . Annual Exam    HPI Harold Benton presents for physical exam.  Generally very healthy.  He still runs several days per week.  He has recently had some issues with left hamstring pain.  Tends to be worse especially when he starts running.  This has been going on for a couple of months now.  He thinks he may have restless leg syndrome.  He has frequent twitches of his right lower extremity at night.  This has been more bothersome to his wife.  No muscle cramps. Minimal caffeine use.  No regular alcohol use.  Takes no medications.  Has never smoked.  Maintenance reviewed  -Needs flu vaccine -Covid vaccine already given -Previous hepatitis C screen negative -Tetanus due 2028 -Colonoscopy due 11/22   Past Medical History:  Diagnosis Date  . 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 EXPLORATORY LAPAROTOMY  1984  . tecticle removal  1984    Family History  Problem Relation Age of Onset  . Cancer Mother        ?type  . Cancer Father        CLL  . Colon cancer Neg Hx   . Stomach cancer Neg Hx     Social History   Socioeconomic History  . Marital status: Married    Spouse name: Not on file  . Number of children: Not on file  . Years of education: Not on file  . Highest education level: Not on file  Occupational History  . Not on file  Tobacco Use  . Smoking status: Never Smoker  . Smokeless tobacco: Never Used  Substance and Sexual Activity  . Alcohol use: Yes    Alcohol/week: 6.0 standard drinks    Types: 6 Cans of beer per week  . Drug use: No  . Sexual activity: Not on file  Other Topics Concern  . Not on file  Social History Narrative  . Not on file   Social Determinants of Health   Financial Resource Strain:   . Difficulty of  Paying Living Expenses: Not on file  Food Insecurity:   . Worried About Charity fundraiser in the Last Year: Not on file  . Ran Out of Food in the Last Year: Not on file  Transportation Needs:   . Lack of Transportation (Medical): Not on file  . Lack of Transportation (Non-Medical): Not on file  Physical Activity:   . Days of Exercise per Week: Not on file  . Minutes of Exercise per Session: Not on file  Stress:   . Feeling of Stress : Not on file  Social Connections:   . Frequency of Communication with Friends and Family: Not on file  . Frequency of Social Gatherings with Friends and Family: Not on file  . Attends Religious Services: Not on file  . Active Member of Clubs or Organizations: Not on file  . Attends Archivist Meetings: Not on file  . Marital Status: Not on file  Intimate Partner Violence:   . Fear of Current or Ex-Partner: Not on file  . Emotionally Abused: Not on file  . Physically Abused: Not on file  . Sexually Abused: Not on file    Outpatient Medications Prior to Visit  Medication Sig Dispense Refill  . Ascorbic Acid (VITAMIN C) 1000 MG tablet Take 1,000 mg by mouth daily.      . calcium carbonate (OS-CAL) 600 MG TABS Take 600 mg by mouth daily.      Marland Kitchen erythromycin ophthalmic ointment Place 1 application into the left eye 4 (four) times daily. 3.5 g 0  . loratadine (CLARITIN) 10 MG tablet Take 10 mg by mouth daily.    . Multiple Vitamin (MULTIVITAMIN) tablet Take 1 tablet by mouth daily.      . sildenafil (REVATIO) 20 MG tablet TAKE 2-5 TABLET BY MOUTH 1 HOUR PRIOR TO SEXUAL ACTIVITY 50 tablet 0   No facility-administered medications prior to visit.    No Known Allergies  ROS Review of Systems  Constitutional: Negative for activity change, appetite change, fatigue and fever.  HENT: Negative for congestion, ear pain and trouble swallowing.   Eyes: Negative for pain and visual disturbance.  Respiratory: Negative for cough, shortness of breath  and wheezing.   Cardiovascular: Negative for chest pain and palpitations.  Gastrointestinal: Negative for abdominal distention, abdominal pain, blood in stool, constipation, diarrhea, nausea, rectal pain and vomiting.  Genitourinary: Negative for dysuria, hematuria and testicular pain.  Musculoskeletal: Negative for arthralgias and joint swelling.  Skin: Negative for rash.  Neurological: Negative for dizziness, syncope and headaches.  Hematological: Negative for adenopathy.  Psychiatric/Behavioral: Negative for confusion and dysphoric mood.      Objective:    Physical Exam Constitutional:      General: He is not in acute distress.    Appearance: He is well-developed.  HENT:     Head: Normocephalic and atraumatic.     Right Ear: External ear normal.     Left Ear: External ear normal.  Eyes:     Conjunctiva/sclera: Conjunctivae normal.     Pupils: Pupils are equal, round, and reactive to light.  Neck:     Thyroid: No thyromegaly.  Cardiovascular:     Rate and Rhythm: Normal rate and regular rhythm.     Heart sounds: Normal heart sounds. No murmur heard.   Pulmonary:     Effort: No respiratory distress.     Breath sounds: No wheezing or rales.  Abdominal:     General: Bowel sounds are normal. There is no distension.     Palpations: Abdomen is soft. There is no mass.     Tenderness: There is no abdominal tenderness. There is no guarding or rebound.  Musculoskeletal:     Cervical back: Normal range of motion and neck supple.  Lymphadenopathy:     Cervical: No cervical adenopathy.  Skin:    Findings: No rash.  Neurological:     Mental Status: He is alert and oriented to person, place, and time.     Cranial Nerves: No cranial nerve deficit.     Deep Tendon Reflexes: Reflexes normal.     BP 120/70   Pulse (!) 48   Temp 97.8 F (36.6 C) (Oral)   Ht 5\' 11"  (1.803 m)   Wt 143 lb (64.9 kg)   SpO2 100%   BMI 19.94 kg/m  Wt Readings from Last 3 Encounters:  07/22/20 143  lb (64.9 kg)  10/09/19 146 lb (66.2 kg)  12/10/17 147 lb 4.8 oz (66.8 kg)     Health Maintenance Due  Topic Date Due  . HIV Screening  Never done    There are no preventive care reminders to display for this patient.  Lab Results  Component Value  Date   TSH 1.42 12/10/2017   Lab Results  Component Value Date   WBC 10.8 12/10/2017   HGB 13.4 12/10/2017   HCT 39.5 12/10/2017   MCV 84.2 12/10/2017   PLT 169 12/10/2017   Lab Results  Component Value Date   NA 138 12/10/2017   K 3.9 12/10/2017   CO2 31 12/10/2017   GLUCOSE 89 12/10/2017   BUN 18 12/10/2017   CREATININE 0.93 12/10/2017   BILITOT 1.4 (H) 12/10/2017   ALKPHOS 83 12/10/2017   AST 24 12/10/2017   ALT 22 12/10/2017   PROT 7.3 12/10/2017   ALBUMIN 4.9 12/10/2017   CALCIUM 9.8 12/10/2017   GFR 89.01 12/10/2017   Lab Results  Component Value Date   CHOL 194 12/10/2017   Lab Results  Component Value Date   HDL 58.30 12/10/2017   Lab Results  Component Value Date   LDLCALC 114 (H) 12/10/2017   Lab Results  Component Value Date   TRIG 110.0 12/10/2017   Lab Results  Component Value Date   CHOLHDL 3 12/10/2017   No results found for: HGBA1C    Assessment & Plan:   #1 physical examination.  We discussed the following health maintenance issues  -Flu vaccine given -Obtain screening labs -Consider Shingrix vaccine at some point this year -Set up sports medicine referral regarding his 57-month history of left hamstring pain -We discussed screening lab work including PSA  #2 probable restless leg syndrome mostly involving right lower extremity. -We discussed potential medication options including Requip or Mirapex but at this point he wishes to observe -Minimize caffeine use especially at night  No orders of the defined types were placed in this encounter.   Follow-up: No follow-ups on file.    Carolann Littler, MD

## 2020-07-22 NOTE — Patient Instructions (Addendum)
Restless Legs Syndrome Restless legs syndrome is a condition that causes uncomfortable feelings or sensations in the legs, especially while sitting or lying down. The sensations usually cause an overwhelming urge to move the legs. The arms can also sometimes be affected. The condition can range from mild to severe. The symptoms often interfere with a person's ability to sleep. What are the causes? The cause of this condition is not known. What increases the risk? The following factors may make you more likely to develop this condition:  Being older than 50.  Pregnancy.  Being a woman. In general, the condition is more common in women than in men.  A family history of the condition.  Having iron deficiency.  Overuse of caffeine, nicotine, or alcohol.  Certain medical conditions, such as kidney disease, Parkinson's disease, or nerve damage.  Certain medicines, such as those for high blood pressure, nausea, colds, allergies, depression, and some heart conditions. What are the signs or symptoms? The main symptom of this condition is uncomfortable sensations in the legs, such as:  Pulling.  Tingling.  Prickling.  Throbbing.  Crawling.  Burning. Usually, the sensations:  Affect both sides of the body.  Are worse when you sit or lie down.  Are worse at night. These may wake you up or make it difficult to fall asleep.  Make you have a strong urge to move your legs.  Are temporarily relieved by moving your legs. The arms can also be affected, but this is rare. People who have this condition often have tiredness during the day because of their lack of sleep at night. How is this diagnosed? This condition may be diagnosed based on:  Your symptoms.  Blood tests. In some cases, you may be monitored in a sleep lab by a specialist (a sleep study). This can detect any disruptions in your sleep. How is this treated? This condition is treated by managing the symptoms. This may  include:  Lifestyle changes, such as exercising, using relaxation techniques, and avoiding caffeine, alcohol, or tobacco.  Medicines. Anti-seizure medicines may be tried first. Follow these instructions at home:     General instructions  Take over-the-counter and prescription medicines only as told by your health care provider.  Use methods to help relieve the uncomfortable sensations, such as: ? Massaging your legs. ? Walking or stretching. ? Taking a cold or hot bath.  Keep all follow-up visits as told by your health care provider. This is important. Lifestyle  Practice good sleep habits. For example, go to bed and get up at the same time every day. Most adults should get 7-9 hours of sleep each night.  Exercise regularly. Try to get at least 30 minutes of exercise most days of the week.  Practice ways of relaxing, such as yoga or meditation.  Avoid caffeine and alcohol.  Do not use any products that contain nicotine or tobacco, such as cigarettes and e-cigarettes. If you need help quitting, ask your health care provider. Contact a health care provider if:  Your symptoms get worse or they do not improve with treatment. Summary  Restless legs syndrome is a condition that causes uncomfortable feelings or sensations in the legs, especially while sitting or lying down.  The symptoms often interfere with a person's ability to sleep.  This condition is treated by managing the symptoms. You may need to make lifestyle changes or take medicines. This information is not intended to replace advice given to you by your health care provider. Make sure   you discuss any questions you have with your health care provider. Document Revised: 11/08/2017 Document Reviewed: 11/08/2017 Elsevier Patient Education  2020 Elsevier Inc. Preventive Care 59-8 Years Old, Male Preventive care refers to lifestyle choices and visits with your health care provider that can promote health and wellness.  This includes:  A yearly physical exam. This is also called an annual well check.  Regular dental and eye exams.  Immunizations.  Screening for certain conditions.  Healthy lifestyle choices, such as eating a healthy diet, getting regular exercise, not using drugs or products that contain nicotine and tobacco, and limiting alcohol use. What can I expect for my preventive care visit? Physical exam Your health care provider will check:  Height and weight. These may be used to calculate body mass index (BMI), which is a measurement that tells if you are at a healthy weight.  Heart rate and blood pressure.  Your skin for abnormal spots. Counseling Your health care provider may ask you questions about:  Alcohol, tobacco, and drug use.  Emotional well-being.  Home and relationship well-being.  Sexual activity.  Eating habits.  Work and work Statistician. What immunizations do I need?  Influenza (flu) vaccine  This is recommended every year. Tetanus, diphtheria, and pertussis (Tdap) vaccine  You may need a Td booster every 10 years. Varicella (chickenpox) vaccine  You may need this vaccine if you have not already been vaccinated. Zoster (shingles) vaccine  You may need this after age 59. Measles, mumps, and rubella (MMR) vaccine  You may need at least one dose of MMR if you were born in 1957 or later. You may also need a second dose. Pneumococcal conjugate (PCV13) vaccine  You may need this if you have certain conditions and were not previously vaccinated. Pneumococcal polysaccharide (PPSV23) vaccine  You may need one or two doses if you smoke cigarettes or if you have certain conditions. Meningococcal conjugate (MenACWY) vaccine  You may need this if you have certain conditions. Hepatitis A vaccine  You may need this if you have certain conditions or if you travel or work in places where you may be exposed to hepatitis A. Hepatitis B vaccine  You may need  this if you have certain conditions or if you travel or work in places where you may be exposed to hepatitis B. Haemophilus influenzae type b (Hib) vaccine  You may need this if you have certain risk factors. Human papillomavirus (HPV) vaccine  If recommended by your health care provider, you may need three doses over 6 months. You may receive vaccines as individual doses or as more than one vaccine together in one shot (combination vaccines). Talk with your health care provider about the risks and benefits of combination vaccines. What tests do I need? Blood tests  Lipid and cholesterol levels. These may be checked every 5 years, or more frequently if you are over 78 years old.  Hepatitis C test.  Hepatitis B test. Screening  Lung cancer screening. You may have this screening every year starting at age 29 if you have a 30-pack-year history of smoking and currently smoke or have quit within the past 15 years.  Prostate cancer screening. Recommendations will vary depending on your family history and other risks.  Colorectal cancer screening. All adults should have this screening starting at age 15 and continuing until age 50. Your health care provider may recommend screening at age 72 if you are at increased risk. You will have tests every 1-10 years, depending on  your results and the type of screening test.  Diabetes screening. This is done by checking your blood sugar (glucose) after you have not eaten for a while (fasting). You may have this done every 1-3 years.  Sexually transmitted disease (STD) testing. Follow these instructions at home: Eating and drinking  Eat a diet that includes fresh fruits and vegetables, whole grains, lean protein, and low-fat dairy products.  Take vitamin and mineral supplements as recommended by your health care provider.  Do not drink alcohol if your health care provider tells you not to drink.  If you drink alcohol: ? Limit how much you have to  0-2 drinks a day. ? Be aware of how much alcohol is in your drink. In the [place] one drink equals one 12 oz bottle of beer (355 mL), one 5 oz glass of wine (148 mL), or one 1 oz glass of hard liquor (44 mL). Lifestyle  Take daily care of your teeth and gums.  Stay active. Exercise for at least 30 minutes on 5 or more days each week.  Do not use any products that contain nicotine or tobacco, such as cigarettes, e-cigarettes, and chewing tobacco. If you need help quitting, ask your health care provider.  If you are sexually active, practice safe sex. Use a condom or other form of protection to prevent STIs (sexually transmitted infections).  Talk with your health care provider about taking a low-dose aspirin every day starting at age 51. What's next?  Go to your health care provider once a year for a well check visit.  Ask your health care provider how often you should have your eyes and teeth checked.  Stay up to date on all vaccines. This information is not intended to replace advice given to you by your health care provider. Make sure you discuss any questions you have with your health care provider. Document Revised: 10/13/2018 Document Reviewed: 10/13/2018 Elsevier Patient Education  2020 [employer].

## 2020-07-23 ENCOUNTER — Encounter: Payer: Self-pay | Admitting: Family Medicine

## 2020-07-23 LAB — CBC WITH DIFFERENTIAL/PLATELET
Absolute Monocytes: 384 cells/uL (ref 200–950)
Basophils Absolute: 51 cells/uL (ref 0–200)
Basophils Relative: 0.4 %
Eosinophils Absolute: 38 cells/uL (ref 15–500)
Eosinophils Relative: 0.3 %
HCT: 39.2 % (ref 38.5–50.0)
Hemoglobin: 13.2 g/dL (ref 13.2–17.1)
Lymphs Abs: 9523 cells/uL — ABNORMAL HIGH (ref 850–3900)
MCH: 30.3 pg (ref 27.0–33.0)
MCHC: 33.7 g/dL (ref 32.0–36.0)
MCV: 90.1 fL (ref 80.0–100.0)
MPV: 11.9 fL (ref 7.5–12.5)
Monocytes Relative: 3 %
Neutro Abs: 2803 cells/uL (ref 1500–7800)
Neutrophils Relative %: 21.9 %
Platelets: 139 10*3/uL — ABNORMAL LOW (ref 140–400)
RBC: 4.35 10*6/uL (ref 4.20–5.80)
RDW: 13.6 % (ref 11.0–15.0)
Total Lymphocyte: 74.4 %
WBC: 12.8 10*3/uL — ABNORMAL HIGH (ref 3.8–10.8)

## 2020-07-23 LAB — HEPATIC FUNCTION PANEL
AG Ratio: 2.3 (calc) (ref 1.0–2.5)
ALT: 22 U/L (ref 9–46)
AST: 25 U/L (ref 10–35)
Albumin: 4.4 g/dL (ref 3.6–5.1)
Alkaline phosphatase (APISO): 66 U/L (ref 35–144)
Bilirubin, Direct: 0.2 mg/dL (ref 0.0–0.2)
Globulin: 1.9 g/dL (calc) (ref 1.9–3.7)
Indirect Bilirubin: 0.7 mg/dL (calc) (ref 0.2–1.2)
Total Bilirubin: 0.9 mg/dL (ref 0.2–1.2)
Total Protein: 6.3 g/dL (ref 6.1–8.1)

## 2020-07-23 LAB — LIPID PANEL
Cholesterol: 185 mg/dL (ref <200)
HDL: 60 mg/dL (ref >=40)
LDL Cholesterol (Calc): 107 mg/dL (calc) — ABNORMAL HIGH
Non-HDL Cholesterol (Calc): 125 mg/dL (calc) (ref <130)
Total CHOL/HDL Ratio: 3.1 (calc) (ref <5.0)
Triglycerides: 89 mg/dL (ref <150)

## 2020-07-23 LAB — BASIC METABOLIC PANEL
BUN/Creatinine Ratio: 25 (calc) — ABNORMAL HIGH (ref 6–22)
BUN: 28 mg/dL — ABNORMAL HIGH (ref 7–25)
CO2: 26 mmol/L (ref 20–32)
Calcium: 9.8 mg/dL (ref 8.6–10.3)
Chloride: 106 mmol/L (ref 98–110)
Creat: 1.1 mg/dL (ref 0.70–1.33)
Glucose, Bld: 75 mg/dL (ref 65–99)
Potassium: 4.2 mmol/L (ref 3.5–5.3)
Sodium: 142 mmol/L (ref 135–146)

## 2020-07-23 LAB — PSA: PSA: 0.19 ng/mL (ref <=4.0)

## 2020-07-23 LAB — TSH: TSH: 1.39 mIU/L (ref 0.40–4.50)

## 2020-07-26 ENCOUNTER — Ambulatory Visit (INDEPENDENT_AMBULATORY_CARE_PROVIDER_SITE_OTHER): Payer: PRIVATE HEALTH INSURANCE

## 2020-07-26 ENCOUNTER — Encounter: Payer: Self-pay | Admitting: Family Medicine

## 2020-07-26 ENCOUNTER — Other Ambulatory Visit: Payer: Self-pay

## 2020-07-26 ENCOUNTER — Ambulatory Visit: Payer: Self-pay

## 2020-07-26 ENCOUNTER — Ambulatory Visit (INDEPENDENT_AMBULATORY_CARE_PROVIDER_SITE_OTHER): Payer: PRIVATE HEALTH INSURANCE | Admitting: Family Medicine

## 2020-07-26 VITALS — BP 138/74 | HR 53 | Ht 71.0 in | Wt 146.6 lb

## 2020-07-26 DIAGNOSIS — M79671 Pain in right foot: Secondary | ICD-10-CM

## 2020-07-26 DIAGNOSIS — S76302A Unspecified injury of muscle, fascia and tendon of the posterior muscle group at thigh level, left thigh, initial encounter: Secondary | ICD-10-CM

## 2020-07-26 NOTE — Patient Instructions (Signed)
Thank you for coming in today.  Please get an Xray today before you leave  The [place] L Protocol for Hamstring Strains  [facility]  Happy to refer you back to you PT at [facility]. Let me know.   Please use voltaren gel up to 4x daily for pain as needed.    If not improved recheck in 4-6 weeks.   Would recommend formal PT if not getting better.   Try using an arch strap on the right foot. One without a pad.

## 2020-07-26 NOTE — Progress Notes (Signed)
Subjective:    I'm seeing this patient as a consultation for:  Dr Elease Hashimoto. Note will be routed back to referring provider/PCP.  CC: L hamstring  I, Wendy Poet, LAT, ATC, am serving as scribe for Dr. Lynne Leader.  HPI: Pt is a 59 y/o male presenting w/ c/o L hamstring pain x 2 months w/ no known MOI.  Pt is a runner and runs 15-20 miles/week and he also plays tennis.  He locates his pain specifically to his proximal hamstring.  Low back pain: No Aggravating factors: first few steps after sitting for a prolonged period of time; running Treatments tried: stretching; massage  Pt is also c/o R lateral foot pain x approximately 2 months w/ no known MOI.  He states that his foot feels like his R lateral foot is "sprained" and feels weak.  He reports pain w/ weight bearing and at rest.  Pt has a history of RLS.  Past medical history, Surgical history, Family history, Social history, Allergies, and medications have been entered into the medical record, reviewed.   Review of Systems: No new headache, visual changes, nausea, vomiting, diarrhea, constipation, dizziness, abdominal pain, skin rash, fevers, chills, night sweats, weight loss, swollen lymph nodes, body aches, joint swelling, muscle aches, chest pain, shortness of breath, mood changes, visual or auditory hallucinations.   Objective:    Vitals:   07/26/20 0940  BP: 138/74  Pulse: (!) 53  SpO2: 95%   General: Well Developed, well nourished, and in no acute distress.  Neuro/Psych: Alert and oriented x3, extra-ocular muscles intact, able to move all 4 extremities, sensation grossly intact. Skin: Warm and dry, no rashes noted.  Respiratory: Not using accessory muscles, speaking in full sentences, trachea midline.  Cardiovascular: Pulses palpable, no extremity edema. Abdomen: Does not appear distended. MSK: Left hip normal-appearing Tender to palpation left ischial tuberosity and.  Proximal hamstring. Hip is otherwise  nontender. Normal hip motion. Pain reproduced with resisted knee flexion. Positive H test. Patient is unable to extend his knee fully with his hip flexed to 90 degrees.  Right foot normal-appearing normal motion Not particularly tender palpation.  Area of pain is dorsal lateral midfoot near ATFL region. Normal foot motion. Stable ligamentous exam. Intact strength.  Lab and Radiology Results  X-ray images right foot obtained today personally and independently interpreted No fractures or significant degenerative change visible. Await radiology over read  Diagnostic Limited MSK Ultrasound of: Left hamstring Origin of hamstring at ischial tuberosity visualized.  Slight area of hyperechoic change at mid tendon insertion indicating either somewhat remote avulsion or chronic calcific tendinopathy.  No significant tendon defect visible. Impression: Insertional hamstring tendinopathy   Impression and Recommendations:    Assessment and Plan: 59 y.o. male with left hamstring strain or injury.  Plan to treat with home exercise as taught in clinic today by ATC.  If not improving will refer to physical therapy for formal PT.  He has a relationship already with [facility].  Advance activity as tolerated.  Right dorsal lateral midfoot pain.  Fundamental etiology is somewhat unclear at this time however overuse strain or loading the foot differently due to slight limp with running due to hamstring is most likely explanation.  Discussed conservative management including Voltaren gel and arch straps.  Fundamentally I think the foot pain will improve when the hamstring strain improves.  If not improving can proceed with further work-up if needed.  Recheck as needed.  PDMP not reviewed this encounter. Orders Placed This Encounter  Procedures  . [place] LIMITED JOINT SPACE STRUCTURES LOW LEFT(NO LINKED CHARGES)    Order Specific Question:   Reason for Exam (SYMPTOM  OR DIAGNOSIS REQUIRED)    Answer:   L  hamstring pain    Order Specific Question:   Preferred imaging location?    Answer:   [place]  . DG Foot Complete Right    Standing Status:   Future    Number of Occurrences:   1    Standing Expiration Date:   07/26/2021    Order Specific Question:   Reason for Exam (SYMPTOM  OR DIAGNOSIS REQUIRED)    Answer:   eval foot pain r lat midfoot    Order Specific Question:   Preferred imaging location?    Answer:   [facility]    Order Specific Question:   Radiology Contrast Protocol - do NOT remove file path    Answer:   [email or web address]   No orders of the defined types were placed in this encounter.   Discussed warning signs or symptoms. Please see discharge instructions. Patient expresses understanding.   The above documentation has been reviewed and is accurate and complete Lynne Leader, M.D.

## 2020-07-29 NOTE — Progress Notes (Signed)
X-ray right foot looks normal to radiology

## 2020-07-30 ENCOUNTER — Other Ambulatory Visit: Payer: Self-pay | Admitting: Family Medicine

## 2020-11-09 ENCOUNTER — Other Ambulatory Visit: Payer: PRIVATE HEALTH INSURANCE

## 2020-11-11 ENCOUNTER — Other Ambulatory Visit: Payer: PRIVATE HEALTH INSURANCE

## 2020-12-17 ENCOUNTER — Encounter: Payer: Self-pay | Admitting: Family Medicine

## 2020-12-23 ENCOUNTER — Emergency Department (INDEPENDENT_AMBULATORY_CARE_PROVIDER_SITE_OTHER): Payer: 59

## 2020-12-23 ENCOUNTER — Emergency Department (INDEPENDENT_AMBULATORY_CARE_PROVIDER_SITE_OTHER)
Admission: EM | Admit: 2020-12-23 | Discharge: 2020-12-23 | Disposition: A | Discharge status: Home / Self Care | Payer: 59 | Source: Home / Self Care | Attending: Family Medicine | Admitting: Family Medicine

## 2020-12-23 ENCOUNTER — Other Ambulatory Visit: Payer: Self-pay

## 2020-12-23 DIAGNOSIS — J181 Lobar pneumonia, unspecified organism: Secondary | ICD-10-CM | POA: Diagnosis not present

## 2020-12-23 DIAGNOSIS — J189 Pneumonia, unspecified organism: Secondary | ICD-10-CM | POA: Diagnosis not present

## 2020-12-23 MED ORDER — AZITHROMYCIN 250 MG PO TABS: ORAL_TABLET | ORAL | 0 refills | Status: DC

## 2020-12-23 MED ORDER — AMOXICILLIN-POT CLAVULANATE 875-125 MG PO TABS: 2.0000 | ORAL_TABLET | Freq: Two times a day (BID) | ORAL | 0 refills | Status: DC

## 2020-12-23 NOTE — Discharge Instructions (Addendum)
Make sure you drink plenty of fluids You must rest Take the Augmentin antibiotic twice a day as directed Take the Z-Pak 2 pills today and then 1 a day until gone Consider taking a probiotic while you are on these antibiotics to protect your stomach Make an appointment with your primary care doctor for 3 to 4 weeks.  You need a repeat chest x-ray to make sure that this infection clears Return here or to the emergency room if you are worse instead of better

## 2020-12-23 NOTE — ED Provider Notes (Signed)
[facility]    CSN: [id number] Arrival date & time: 12/23/20  1019      History   Chief Complaint Chief Complaint  Patient presents with  . Cough  . Chest congestion    HPI Harold Benton is a 60 y.o. male.   HPI   Patient has had an episode of Covid in January.  He recovered from this.  He has had his COVID vaccinations.  Currently has an upper respiratory infection for about 2 weeks.  He has runny stuffy nose.  Postnasal drip.  Coughing.  Chest congestion.  Pain in chest with deep breath.  He has been coughing up streaks of blood that he thinks are from his lung.  Recently flew to [facility] for vacation and states that his energy level was low and he was not able to do as much as he normally does.  No nausea or vomiting.  No weight loss  Past Medical History:  Diagnosis Date  . Testicular cancer [facility]) 1984    Patient Active Problem List   Diagnosis Date Noted  . History of testicular cancer 12/11/2017  . OTITIS MEDIA, SUPPURATIVE, ACUTE 09/16/2009  . ACUTE MAXILLARY SINUSITIS 09/16/2009  . Asthma 06/17/2009    Past Surgical History:  Procedure Laterality Date  . EXPLORATORY LAPAROTOMY  1984  . tecticle removal  1984       Home Medications    Prior to Admission medications   Medication Sig Start Date End Date Taking? Authorizing Provider  Ascorbic Acid (VITAMIN C) 1000 MG tablet Take 1,000 mg by mouth daily.      [provider]  calcium carbonate (OS-CAL) 600 MG TABS Take 600 mg by mouth daily.      [provider]  erythromycin ophthalmic ointment Place 1 application into the left eye 4 (four) times daily. 10/09/19   Laurey Morale, MD  loratadine (CLARITIN) 10 MG tablet Take 10 mg by mouth daily.    [provider]  Multiple Vitamin (MULTIVITAMIN) tablet Take 1 tablet by mouth daily.      [provider]  sildenafil (REVATIO) 20 MG tablet TAKE 2-5 TABLETS BY MOUTH 1 HOUR PIRO TO SEXUAL ACTIVITY 07/30/20    Burchette, Alinda Sierras, MD    Family History Family History  Problem Relation Age of Onset  . Cancer Mother        ?type  . Cancer Father        CLL  . Colon cancer Neg Hx   . Stomach cancer Neg Hx     Social History Social History   Tobacco Use  . Smoking status: Never Smoker  . Smokeless tobacco: Never Used  Substance Use Topics  . Alcohol use: Yes    Alcohol/week: 6.0 standard drinks    Types: 6 Cans of beer per week  . Drug use: No     Allergies   Patient has no known allergies.   Review of Systems Review of Systems See HPI  Physical Exam Triage Vital Signs ED Triage Vitals  Enc Vitals Group     BP 12/23/20 1035 137/85     Pulse Rate 12/23/20 1035 71     Resp 12/23/20 1035 17     Temp 12/23/20 1035 98.4 F (36.9 C)     Temp Source 12/23/20 1035 Oral     SpO2 12/23/20 1035 96 %     Weight --      Height --      Head Circumference --  Peak Flow --      Pain Score 12/23/20 1036 0     Pain Loc --      Pain Edu? --      Excl. in [place]? --    No data found.  Updated Vital Signs BP 137/85 (BP Location: Right Arm)   Pulse 71   Temp 98.4 F (36.9 C) (Oral)   Resp 17   SpO2 96%      Physical Exam Constitutional:      General: He is not in acute distress.    Appearance: He is well-developed, normal weight and well-nourished.     Comments: Lean and healthy.  Mask in place  HENT:     Head: Normocephalic and atraumatic.     Right Ear: Tympanic membrane and ear canal normal.     Left Ear: Tympanic membrane and ear canal normal.     Nose: Congestion and rhinorrhea present.     Mouth/Throat:     Mouth: Oropharynx is clear and moist. Mucous membranes are moist.  Eyes:     Conjunctiva/sclera: Conjunctivae normal.     Pupils: Pupils are equal, round, and reactive to light.  Cardiovascular:     Rate and Rhythm: Normal rate and regular rhythm.  Pulmonary:     Effort: Pulmonary effort is normal. No respiratory distress.     Breath sounds: Rales present.      Comments: Harsh central rhonchi, rare wheeze centrally Abdominal:     General: There is no distension.     Palpations: Abdomen is soft.  Musculoskeletal:        General: No edema. Normal range of motion.     Cervical back: Normal range of motion.  Skin:    General: Skin is warm and dry.  Neurological:     General: No focal deficit present.     Mental Status: He is alert.  Psychiatric:        Behavior: Behavior normal.      UC Treatments / Results  Labs (all labs ordered are listed, but only abnormal results are displayed) Labs Reviewed - No data to display  EKG   Radiology No results found.  Procedures Procedures (including critical care time)  Medications Ordered in UC Medications - No data to display  Initial Impression / Assessment and Plan / UC Course  I have reviewed the triage vital signs and the nursing notes.  Pertinent labs & imaging results that were available during my care of the patient were reviewed by me and considered in my medical decision making (see chart for details).     Discussed with patient that he has pneumonia.  He must rest and push fluids, take high-dose antibiotics.  He needs a chest x-ray in 3 to 4 weeks to make sure this cleared.  This can be done by his primary care doctor. Final Clinical Impressions(s) / UC Diagnoses   Final diagnoses:  None   Discharge Instructions   None    ED Prescriptions    None     PDMP not reviewed this encounter.   Raylene Everts, MD 12/23/20 [phone]

## 2020-12-23 NOTE — ED Triage Notes (Signed)
Pt c/o sinus congestion x 2 weeks ago. Says its now settled into chest. Semi productive cough. Taking Advil cold and sinus and mucinex prn. Blood streaked mucous. Had covid in early January. Vaccinated and boosted.

## 2021-01-02 ENCOUNTER — Encounter: Payer: Self-pay | Admitting: Family Medicine

## 2021-01-10 ENCOUNTER — Encounter: Payer: Self-pay | Admitting: Family Medicine

## 2021-01-10 ENCOUNTER — Other Ambulatory Visit: Payer: Self-pay

## 2021-01-10 ENCOUNTER — Ambulatory Visit (INDEPENDENT_AMBULATORY_CARE_PROVIDER_SITE_OTHER): Payer: 59 | Admitting: Family Medicine

## 2021-01-10 ENCOUNTER — Ambulatory Visit (INDEPENDENT_AMBULATORY_CARE_PROVIDER_SITE_OTHER): Payer: 59

## 2021-01-10 VITALS — BP 132/80 | HR 52 | Temp 97.6°F | Ht 71.0 in | Wt 142.0 lb

## 2021-01-10 DIAGNOSIS — J189 Pneumonia, unspecified organism: Secondary | ICD-10-CM

## 2021-01-10 NOTE — Progress Notes (Signed)
Established Patient Office Visit  Subjective:  Patient ID: Harold Benton, male    DOB: 1961-07-14  Age: 60 y.o. MRN: [id number]  CC:  Chief Complaint  Patient presents with  . Follow-up    Discuss results x-rays , still having congestion after finishing antibiotic     HPI [name] presents for follow-up regarding community-acquired left lower lobe pneumonia.  He had recent trip to [facility].  He actually felt poorly prior to his trip.  When he was out there he tried to engage in virtual visit but we could not do that because of interstate restrictions with virtual visits.  He came back from his [place] trip after feeling poorly for a week out there and had some cough and went to urgent care center over in [place].  Chest x-ray showed left lower lobe pneumonia.  His preceding symptoms for 2 weeks had been nasal congestion and cough predominantly.  He also had some left-sided pain with deep breathing.  He had coughed up a few streaks of blood prior to the urgent care visit.  He was placed on Augmentin and Zithromax.  Was treated with 14 days of Augmentin.  Had some initial diarrhea which resolved after starting probiotic.  No further diarrhea at this time.  Still has occasional cough but overall improved.  No further hemoptysis.  Weight is back almost to baseline.  Patient has never smoked.  Has been prone to sinus infections but no chronic lung disease.  Remote history of testicular cancer.  Past Medical History:  Diagnosis Date  . Testicular cancer ([place]) 1984    Past Surgical History:  Procedure Laterality Date  . EXPLORATORY LAPAROTOMY  1984  . tecticle removal  1984    Family History  Problem Relation Age of Onset  . Cancer Mother        ?type  . Cancer Father        CLL  . Colon cancer Neg Hx   . Stomach cancer Neg Hx     Social History   Socioeconomic History  . Marital status: Married    Spouse name: Not on file  . Number of children: Not on file  .  Years of education: Not on file  . Highest education level: Not on file  Occupational History  . Not on file  Tobacco Use  . Smoking status: Never Smoker  . Smokeless tobacco: Never Used  Substance and Sexual Activity  . Alcohol use: Yes    Alcohol/week: 6.0 standard drinks    Types: 6 Cans of beer per week  . Drug use: No  . Sexual activity: Not on file  Other Topics Concern  . Not on file  Social History Narrative  . Not on file   Social Determinants of Health   Financial Resource Strain: Not on file  Food Insecurity: Not on file  Transportation Needs: Not on file  Physical Activity: Not on file  Stress: Not on file  Social Connections: Not on file  Intimate Partner Violence: Not on file    Outpatient Medications Prior to Visit  Medication Sig Dispense Refill  . Ascorbic Acid (VITAMIN C) 1000 MG tablet Take 1,000 mg by mouth daily.    . calcium carbonate (OS-CAL) 600 MG TABS Take 600 mg by mouth daily.    Marland Kitchen erythromycin ophthalmic ointment Place 1 application into the left eye 4 (four) times daily. 3.5 g 0  . loratadine (CLARITIN) 10 MG tablet Take 10 mg by  mouth daily.    . Multiple Vitamin (MULTIVITAMIN) tablet Take 1 tablet by mouth daily.    . sildenafil (REVATIO) 20 MG tablet TAKE 2-5 TABLETS BY MOUTH 1 HOUR PIRO TO SEXUAL ACTIVITY 50 tablet 5  . amoxicillin-clavulanate (AUGMENTIN) 875-125 MG tablet Take 2 tablets by mouth 2 (two) times daily. 28 tablet 0  . azithromycin (ZITHROMAX Z-PAK) 250 MG tablet Take two pills today followed by one a day until gone 6 tablet 0   No facility-administered medications prior to visit.    No Known Allergies  ROS Review of Systems  Constitutional: Negative for appetite change, chills and fever.  Respiratory: Positive for cough. Negative for shortness of breath and wheezing.   Cardiovascular: Negative for chest pain and palpitations.      Objective:    Physical Exam Vitals reviewed.  Constitutional:      Appearance:  Normal appearance.  Cardiovascular:     Rate and Rhythm: Normal rate and regular rhythm.  Pulmonary:     Effort: Pulmonary effort is normal.     Breath sounds: No wheezing.     Comments: Still has a few faint crackles in the left base.  Right base is clear Neurological:     Mental Status: He is alert.     BP 132/80   Pulse (!) 52   Temp 97.6 F (36.4 C) (Oral)   Ht 5\' 11"  (1.803 m)   Wt 142 lb (64.4 kg)   SpO2 98%   BMI 19.80 kg/m  Wt Readings from Last 3 Encounters:  01/10/21 142 lb (64.4 kg)  07/26/20 146 lb 9.6 oz (66.5 kg)  07/22/20 143 lb (64.9 kg)     Health Maintenance Due  Topic Date Due  . HIV Screening  Never done    There are no preventive care reminders to display for this patient.  Lab Results  Component Value Date   TSH 1.39 07/22/2020   Lab Results  Component Value Date   WBC 12.8 (H) 07/22/2020   HGB 13.2 07/22/2020   HCT 39.2 07/22/2020   MCV 90.1 07/22/2020   PLT 139 (L) 07/22/2020   Lab Results  Component Value Date   NA 142 07/22/2020   K 4.2 07/22/2020   CO2 26 07/22/2020   GLUCOSE 75 07/22/2020   BUN 28 (H) 07/22/2020   CREATININE 1.10 07/22/2020   BILITOT 0.9 07/22/2020   ALKPHOS 83 12/10/2017   AST 25 07/22/2020   ALT 22 07/22/2020   PROT 6.3 07/22/2020   ALBUMIN 4.9 12/10/2017   CALCIUM 9.8 07/22/2020   GFR 89.01 12/10/2017   Lab Results  Component Value Date   CHOL 185 07/22/2020   Lab Results  Component Value Date   HDL 60 07/22/2020   Lab Results  Component Value Date   LDLCALC 107 (H) 07/22/2020   Lab Results  Component Value Date   TRIG 89 07/22/2020   Lab Results  Component Value Date   CHOLHDL 3.1 07/22/2020   No results found for: HGBA1C    Assessment & Plan:   Problem List Items Addressed This Visit   None   Visit Diagnoses    Community acquired pneumonia of left lower lobe of lung    -  Primary   Relevant Orders   DG Chest 2 View    Patient overall is clinically improved.  Pulse  oximeter today 98%.  Appetite improving.  Minimal cough.  No fever.  -Obtain follow-up chest x-ray today.  Not quite at 3-week interval  but close. -Chest x-ray shows interval improvement in left lower lobe pneumonia but still has some residual opacity.  Will recommend follow-up chest x-ray in about 3 weeks to ensure full resolution. -Follow-up sooner for any recurrent fever, increased shortness of breath, or other concerns. No orders of the defined types were placed in this encounter.   Follow-up: No follow-ups on file.    Carolann Littler, MD

## 2021-01-10 NOTE — Patient Instructions (Signed)
I will call you CXR results by early next week  Follow up for any fever or increased shortness of breath

## 2021-01-29 ENCOUNTER — Other Ambulatory Visit: Payer: Self-pay

## 2021-01-30 ENCOUNTER — Other Ambulatory Visit: Payer: 59

## 2021-01-30 ENCOUNTER — Ambulatory Visit (INDEPENDENT_AMBULATORY_CARE_PROVIDER_SITE_OTHER): Payer: 59

## 2021-01-30 DIAGNOSIS — J189 Pneumonia, unspecified organism: Secondary | ICD-10-CM

## 2021-02-10 ENCOUNTER — Encounter: Payer: Self-pay | Admitting: Family Medicine

## 2021-02-11 ENCOUNTER — Encounter: Payer: Self-pay | Admitting: Family Medicine

## 2021-02-11 ENCOUNTER — Ambulatory Visit (INDEPENDENT_AMBULATORY_CARE_PROVIDER_SITE_OTHER): Payer: 59 | Admitting: Family Medicine

## 2021-02-11 ENCOUNTER — Other Ambulatory Visit: Payer: Self-pay

## 2021-02-11 VITALS — BP 140/62 | HR 72 | Temp 98.0°F | Wt 143.8 lb

## 2021-02-11 DIAGNOSIS — R0989 Other specified symptoms and signs involving the circulatory and respiratory systems: Secondary | ICD-10-CM

## 2021-02-11 DIAGNOSIS — J90 Pleural effusion, not elsewhere classified: Secondary | ICD-10-CM

## 2021-02-11 DIAGNOSIS — R059 Cough, unspecified: Secondary | ICD-10-CM

## 2021-02-11 LAB — CBC WITH DIFFERENTIAL/PLATELET
Basophils Absolute: 0 10*3/uL (ref 0.0–0.1)
Basophils Relative: 0.3 % (ref 0.0–3.0)
Eosinophils Absolute: 0.1 10*3/uL (ref 0.0–0.7)
Eosinophils Relative: 0.4 % (ref 0.0–5.0)
HCT: 37.5 % — ABNORMAL LOW (ref 39.0–52.0)
Hemoglobin: 12.5 g/dL — ABNORMAL LOW (ref 13.0–17.0)
Lymphocytes Relative: 61.7 % — ABNORMAL HIGH (ref 12.0–46.0)
Lymphs Abs: 11.3 10*3/uL — ABNORMAL HIGH (ref 0.7–4.0)
MCHC: 33.2 g/dL (ref 30.0–36.0)
MCV: 88.6 fl (ref 78.0–100.0)
Monocytes Absolute: 0.6 10*3/uL (ref 0.1–1.0)
Monocytes Relative: 3.3 % (ref 3.0–12.0)
Neutro Abs: 6.3 10*3/uL (ref 1.4–7.7)
Neutrophils Relative %: 34.3 % — ABNORMAL LOW (ref 43.0–77.0)
Platelets: 158 10*3/uL (ref 150.0–400.0)
RBC: 4.23 Mil/uL (ref 4.22–5.81)
RDW: 15.9 % — ABNORMAL HIGH (ref 11.5–15.5)
WBC: 18.3 10*3/uL (ref 4.0–10.5)

## 2021-02-11 LAB — C-REACTIVE PROTEIN: CRP: 12.3 mg/dL (ref 0.5–20.0)

## 2021-02-11 MED ORDER — ALBUTEROL SULFATE HFA 108 (90 BASE) MCG/ACT IN AERS: 2.0000 | INHALATION_SPRAY | Freq: Four times a day (QID) | RESPIRATORY_TRACT | 0 refills | Status: DC | PRN

## 2021-02-11 MED ORDER — LEVOFLOXACIN 500 MG PO TABS: 500.0000 mg | ORAL_TABLET | Freq: Every day | ORAL | 0 refills | Status: DC

## 2021-02-11 NOTE — Progress Notes (Signed)
Established Patient Office Visit  Subjective:  Patient ID: Harold Benton, male    DOB: Jul 17, 1961  Age: 60 y.o. MRN: [id number]  CC:  Chief Complaint  Patient presents with  . Follow-up    HPI Harold Benton presents for worsening chest congestion and recurrent fever.  Patient was traveling back in February out in [place].  He came back and was diagnosed with left lower lobe pneumonia.  He was treated initially with Zithromax and then subsequently Augmentin for 14 days.  He had serial chest x-rays with most recent on 01-30-21 which showed partial resolution of left basilar airspace opacity and pleural fluid suggesting resolving infection.  There is mention of "chronic changes and emphysema ".  Patient has never smoked.  There is some question of him having a grandmother with emphysema who is a non-smoker.  He was improved until couple days ago.  He developed some recurrent sinus symptoms along with chills and temperature of 100.7.  He has had cough productive of thick yellow sputum.  No hemoptysis.  He also is noticing some increased dyspnea with minimal activities.  No chest pains.  At baseline, usually runs a few times per week without difficulty.  Denies any recent skin rashes.  No significant arthralgias other than some neck pains and neck stiffness.  Generally fairly healthy.  He has had recurrent sinusitis in the past.  Has had some reactive airway issues intermittently.  Remote history of testicular cancer many years ago.  Past Medical History:  Diagnosis Date  . Testicular cancer ([place]) 1984    Past Surgical History:  Procedure Laterality Date  . EXPLORATORY LAPAROTOMY  1984  . tecticle removal  1984    Family History  Problem Relation Age of Onset  . Cancer Mother        ?type  . Cancer Father        CLL  . Colon cancer Neg Hx   . Stomach cancer Neg Hx     Social History   Socioeconomic History  . Marital status: Married    Spouse name: Not on file  . Number  of children: Not on file  . Years of education: Not on file  . Highest education level: Not on file  Occupational History  . Not on file  Tobacco Use  . Smoking status: Never Smoker  . Smokeless tobacco: Never Used  Substance and Sexual Activity  . Alcohol use: Yes    Alcohol/week: 6.0 standard drinks    Types: 6 Cans of beer per week  . Drug use: No  . Sexual activity: Not on file  Other Topics Concern  . Not on file  Social History Narrative  . Not on file   Social Determinants of Health   Financial Resource Strain: Not on file  Food Insecurity: Not on file  Transportation Needs: Not on file  Physical Activity: Not on file  Stress: Not on file  Social Connections: Not on file  Intimate Partner Violence: Not on file    Outpatient Medications Prior to Visit  Medication Sig Dispense Refill  . Ascorbic Acid (VITAMIN C) 1000 MG tablet Take 1,000 mg by mouth daily.    . calcium carbonate (OS-CAL) 600 MG TABS Take 600 mg by mouth daily.    Marland Kitchen erythromycin ophthalmic ointment Place 1 application into the left eye 4 (four) times daily. 3.5 g 0  . loratadine (CLARITIN) 10 MG tablet Take 10 mg by mouth daily.    . Multiple Vitamin (  MULTIVITAMIN) tablet Take 1 tablet by mouth daily.    . sildenafil (REVATIO) 20 MG tablet TAKE 2-5 TABLETS BY MOUTH 1 HOUR PIRO TO SEXUAL ACTIVITY 50 tablet 5   No facility-administered medications prior to visit.    No Known Allergies  ROS Review of Systems  Constitutional: Positive for chills, fatigue and fever.  HENT: Positive for sinus pressure and sinus pain.   Respiratory: Positive for cough and shortness of breath.   Cardiovascular: Negative for chest pain.  Genitourinary: Negative for dysuria.  Musculoskeletal: Positive for neck pain.  Skin: Negative for rash.  Hematological: Negative for adenopathy.      Objective:    Physical Exam Vitals reviewed.  Cardiovascular:     Rate and Rhythm: Normal rate and regular rhythm.   Pulmonary:     Effort: Pulmonary effort is normal.     Comments: He does have some wheezes left lung base and some rhonchi in the left base.  Right side is clear.  No retractions.  Pulse oximetry 93% room air Musculoskeletal:     Cervical back: Neck supple.     Right lower leg: No edema.     Left lower leg: No edema.  Lymphadenopathy:     Cervical: No cervical adenopathy.  Neurological:     Mental Status: He is alert.     BP 140/62 (BP Location: Left Arm, Patient Position: Sitting, Cuff Size: Normal)   Pulse 72   Temp 98 F (36.7 C) (Oral)   Wt 143 lb 12.8 oz (65.2 kg)   SpO2 93%   BMI 20.06 kg/m  Wt Readings from Last 3 Encounters:  02/11/21 143 lb 12.8 oz (65.2 kg)  01/10/21 142 lb (64.4 kg)  07/26/20 146 lb 9.6 oz (66.5 kg)     Health Maintenance Due  Topic Date Due  . HIV Screening  Never done    There are no preventive care reminders to display for this patient.  Lab Results  Component Value Date   TSH 1.39 07/22/2020   Lab Results  Component Value Date   WBC 12.8 (H) 07/22/2020   HGB 13.2 07/22/2020   HCT 39.2 07/22/2020   MCV 90.1 07/22/2020   PLT 139 (L) 07/22/2020   Lab Results  Component Value Date   NA 142 07/22/2020   K 4.2 07/22/2020   CO2 26 07/22/2020   GLUCOSE 75 07/22/2020   BUN 28 (H) 07/22/2020   CREATININE 1.10 07/22/2020   BILITOT 0.9 07/22/2020   ALKPHOS 83 12/10/2017   AST 25 07/22/2020   ALT 22 07/22/2020   PROT 6.3 07/22/2020   ALBUMIN 4.9 12/10/2017   CALCIUM 9.8 07/22/2020   GFR 89.01 12/10/2017   Lab Results  Component Value Date   CHOL 185 07/22/2020   Lab Results  Component Value Date   HDL 60 07/22/2020   Lab Results  Component Value Date   LDLCALC 107 (H) 07/22/2020   Lab Results  Component Value Date   TRIG 89 07/22/2020   Lab Results  Component Value Date   CHOLHDL 3.1 07/22/2020   No results found for: HGBA1C    Assessment & Plan:   Patient presents with presumed left lower lobe  community-acquired pneumonia recently.  Did show some initial improvement following antibiotics as above but now has recurrent symptoms of chills, low-grade fever and productive cough.  Nontoxic in appearance and currently afebrile.  He also has 93% O2 sats compared with 98% recently.  Needs further evaluation at this point with recurrent  fever and recurrent chest congestion.  He also relates frequent recurrent sinusitis symptoms.  Questionable family history of emphysema in a grandmother who is a non-smoker.  He is not aware specifically of any hereditary disorders such as alpha-1 antitrypsin deficiency  -Obtain CT chest to further evaluate -We elected to go ahead and cover with Levaquin 500 mg once daily for 10 days and review potential side effects including risk of tendinopathy and C. difficile colitis -Check further labs with rheumatoid factor, C-reactive protein, CBC with differential, ANCA screen with reflex titer, ANA, alpha-1 antitrypsin -Consider pulmonary referral following testing above -Also send in albuterol MDI 2 puffs every 4-6 hours as needed for wheezing  Meds ordered this encounter  Medications  . levofloxacin (LEVAQUIN) 500 MG tablet    Sig: Take 1 tablet (500 mg total) by mouth daily.    Dispense:  10 tablet    Refill:  0  . albuterol (VENTOLIN HFA) 108 (90 Base) MCG/ACT inhaler    Sig: Inhale 2 puffs into the lungs every 6 (six) hours as needed for wheezing or shortness of breath.    Dispense:  8 g    Refill:  0    Follow-up: No follow-ups on file.    Carolann Littler, MD

## 2021-02-13 LAB — ANCA SCREEN W REFLEX TITER: ANCA Screen: NEGATIVE

## 2021-02-13 LAB — ALPHA-1-ANTITRYPSIN: A-1 Antitrypsin, Ser: 225 mg/dL — ABNORMAL HIGH (ref 83–199)

## 2021-02-13 LAB — RHEUMATOID FACTOR: Rheumatoid fact SerPl-aCnc: 15 IU/mL — ABNORMAL HIGH (ref <14)

## 2021-02-13 LAB — ANA: Anti Nuclear Antibody (ANA): NEGATIVE

## 2021-02-14 ENCOUNTER — Ambulatory Visit (INDEPENDENT_AMBULATORY_CARE_PROVIDER_SITE_OTHER)
Admission: RE | Admit: 2021-02-14 | Discharge: 2021-02-14 | Disposition: A | Discharge status: Home / Self Care | Payer: 59 | Source: Ambulatory Visit | Attending: Family Medicine | Admitting: Family Medicine

## 2021-02-14 ENCOUNTER — Other Ambulatory Visit: Payer: Self-pay

## 2021-02-14 DIAGNOSIS — J90 Pleural effusion, not elsewhere classified: Secondary | ICD-10-CM

## 2021-02-14 DIAGNOSIS — R059 Cough, unspecified: Secondary | ICD-10-CM

## 2021-02-17 ENCOUNTER — Other Ambulatory Visit: Payer: Self-pay | Admitting: Family Medicine

## 2021-02-17 DIAGNOSIS — R059 Cough, unspecified: Secondary | ICD-10-CM

## 2021-02-17 DIAGNOSIS — J181 Lobar pneumonia, unspecified organism: Secondary | ICD-10-CM

## 2021-02-17 NOTE — Progress Notes (Signed)
CT shows some left lower lung consolidation- but no mass.   Setting up pulmonary referral.

## 2021-02-21 ENCOUNTER — Encounter: Payer: Self-pay | Admitting: Family Medicine

## 2021-02-25 ENCOUNTER — Other Ambulatory Visit: Payer: 59

## 2021-03-17 ENCOUNTER — Encounter: Payer: Self-pay | Admitting: Family Medicine

## 2021-03-25 ENCOUNTER — Encounter: Payer: Self-pay | Admitting: Pulmonary Disease

## 2021-03-25 ENCOUNTER — Other Ambulatory Visit: Payer: Self-pay

## 2021-03-25 ENCOUNTER — Ambulatory Visit (INDEPENDENT_AMBULATORY_CARE_PROVIDER_SITE_OTHER): Payer: 59 | Admitting: Pulmonary Disease

## 2021-03-25 ENCOUNTER — Other Ambulatory Visit: Payer: 59

## 2021-03-25 VITALS — BP 124/72 | HR 52 | Temp 97.3°F | Ht 71.0 in | Wt 146.2 lb

## 2021-03-25 DIAGNOSIS — J8489 Other specified interstitial pulmonary diseases: Secondary | ICD-10-CM

## 2021-03-25 DIAGNOSIS — D72820 Lymphocytosis (symptomatic): Secondary | ICD-10-CM | POA: Diagnosis not present

## 2021-03-25 LAB — CBC WITH DIFFERENTIAL/PLATELET
Basophils Absolute: 0.1 10*3/uL (ref 0.0–0.1)
Basophils Relative: 0.3 % (ref 0.0–3.0)
Eosinophils Absolute: 0.1 10*3/uL (ref 0.0–0.7)
Eosinophils Relative: 0.4 % (ref 0.0–5.0)
HCT: 41.7 % (ref 39.0–52.0)
Hemoglobin: 14 g/dL (ref 13.0–17.0)
Lymphocytes Relative: 69.5 % — ABNORMAL HIGH (ref 12.0–46.0)
Lymphs Abs: 16 10*3/uL — ABNORMAL HIGH (ref 0.7–4.0)
MCHC: 33.5 g/dL (ref 30.0–36.0)
MCV: 89.1 fl (ref 78.0–100.0)
Monocytes Absolute: 0.5 10*3/uL (ref 0.1–1.0)
Monocytes Relative: 2.4 % — ABNORMAL LOW (ref 3.0–12.0)
Neutro Abs: 6.3 10*3/uL (ref 1.4–7.7)
Neutrophils Relative %: 27.4 % — ABNORMAL LOW (ref 43.0–77.0)
Platelets: 236 10*3/uL (ref 150.0–400.0)
RBC: 4.67 Mil/uL (ref 4.22–5.81)
RDW: 15.3 % (ref 11.5–15.5)
WBC: 23 10*3/uL (ref 4.0–10.5)

## 2021-03-25 LAB — COMPREHENSIVE METABOLIC PANEL
ALT: 22 U/L (ref 0–53)
AST: 24 U/L (ref 0–37)
Albumin: 4.5 g/dL (ref 3.5–5.2)
Alkaline Phosphatase: 90 U/L (ref 39–117)
BUN: 21 mg/dL (ref 6–23)
CO2: 29 mEq/L (ref 19–32)
Calcium: 10 mg/dL (ref 8.4–10.5)
Chloride: 102 mEq/L (ref 96–112)
Creatinine, Ser: 0.9 mg/dL (ref 0.40–1.50)
GFR: 92.98 mL/min (ref >60.00)
Glucose, Bld: 86 mg/dL (ref 70–99)
Potassium: 4.5 mEq/L (ref 3.5–5.1)
Sodium: 139 mEq/L (ref 135–145)
Total Bilirubin: 0.5 mg/dL (ref 0.2–1.2)
Total Protein: 7 g/dL (ref 6.0–8.3)

## 2021-03-25 LAB — C-REACTIVE PROTEIN: CRP: <1 mg/dL (ref 0.5–20.0)

## 2021-03-25 MED ORDER — PREDNISONE 10 MG PO TABS: 10.0000 mg | ORAL_TABLET | Freq: Every day | ORAL | 0 refills | Status: DC

## 2021-03-25 MED ORDER — STIOLTO RESPIMAT 2.5-2.5 MCG/ACT IN AERS: 2.0000 | INHALATION_SPRAY | Freq: Every day | RESPIRATORY_TRACT | 0 refills | Status: DC

## 2021-03-25 NOTE — Telephone Encounter (Signed)
Reviewed patient's RX. Dr. Erin Fulling sent in 90 tablets. Called [facility] in [place] and stayed on hold for over 10 minutes. Will continue to call back until I can speak with someone.

## 2021-03-25 NOTE — Patient Instructions (Addendum)
We will check labs today concerning for inflammatory conditions.   We will start a prednisone taper to treat organizing pneumonia.  40mg  daily for 7 days 30mg  daily for 7 days 20mg  daily for 7 days 10mg  daily for 7 days 5mg  daily for 7 days   Start stiolto inhaler 2 puffs daily  Use albuterol 1-2 puffs as needed every 4-6 hours for the cough.   We will schedule you for pulmonary function tests in the near future.   We will repeat a chest x-ray at your follow up visit.

## 2021-03-25 NOTE — Progress Notes (Signed)
Synopsis: Referred in May 2022 for cough by Carolann Littler, MD  Subjective:   PATIENT ID: Harold Benton GENDER: male DOB: [birth year], MRN: [id number]   HPI  Chief Complaint  Patient presents with  . Consult    Referred by PCP for cough and abnormal CT back in April. States the cough will start out productive in the mornings. Denies any color to phlegm.     Harold Benton is a 60 year old male, never smoker with history of testicular cancer who is referred to pulmonary clinic for cough.   He reports having a cough since February 2022 where he was diagnosed with left lower lobe pneumonia and treated with augmentin and azithromycin. He initially had improvement clinically and radiographicly on repeat chest radiogrph on 3/11 and 3/31. The cough persisted but worsened again in April where he was treated with a course of levaquin. A CT Chest was done on 02/14/21 which showed airspace disease of the inferior right middle lobe, lingula, right lower lobe and most prominantly in the left lower lobe. There is bronchial wall thickening in the lower lobes with mucous plugging in the bilateral lower lobe airways. Also noted is mildly prominent bilateral axially lymph nodes.  He continues to cough despite the course of levaquin. He is coughing up clear to yellowish sputum. He does have intermittent wheezing.  He also reports right sided neck discomfort and stiffness with limited range of motion of his neck. He also complains of muscle cramping in his calfs and right knee soreness.   His paternal gradparents had emphysema and his father has history of CLL.   Past Medical History:  Diagnosis Date  . Testicular cancer ([place]) 1984     Family History  Problem Relation Age of Onset  . Cancer Mother        ?type  . Cancer Father        CLL  . Colon cancer Neg Hx   . Stomach cancer Neg Hx      Social History   Socioeconomic History  . Marital status: Married    Spouse name: Not on file  .  Number of children: Not on file  . Years of education: Not on file  . Highest education level: Not on file  Occupational History  . Not on file  Tobacco Use  . Smoking status: Never Smoker  . Smokeless tobacco: Never Used  Substance and Sexual Activity  . Alcohol use: Yes    Alcohol/week: 6.0 standard drinks    Types: 6 Cans of beer per week  . Drug use: No  . Sexual activity: Not on file  Other Topics Concern  . Not on file  Social History Narrative  . Not on file   Social Determinants of Health   Financial Resource Strain: Not on file  Food Insecurity: Not on file  Transportation Needs: Not on file  Physical Activity: Not on file  Stress: Not on file  Social Connections: Not on file  Intimate Partner Violence: Not on file     No Known Allergies   Outpatient Medications Prior to Visit  Medication Sig Dispense Refill  . albuterol (VENTOLIN HFA) 108 (90 Base) MCG/ACT inhaler Inhale 2 puffs into the lungs every 6 (six) hours as needed for wheezing or shortness of breath. 8 g 0  . Ascorbic Acid (VITAMIN C) 1000 MG tablet Take 1,000 mg by mouth daily.    . calcium carbonate (OS-CAL) 600 MG TABS Take 600 mg by mouth daily.    Marland Kitchen  loratadine (CLARITIN) 10 MG tablet Take 10 mg by mouth daily.    . Multiple Vitamin (MULTIVITAMIN) tablet Take 1 tablet by mouth daily.    . sildenafil (REVATIO) 20 MG tablet TAKE 2-5 TABLETS BY MOUTH 1 HOUR PIRO TO SEXUAL ACTIVITY 50 tablet 5  . erythromycin ophthalmic ointment Place 1 application into the left eye 4 (four) times daily. 3.5 g 0  . levofloxacin (LEVAQUIN) 500 MG tablet Take 1 tablet (500 mg total) by mouth daily. 10 tablet 0   No facility-administered medications prior to visit.    Review of Systems  Constitutional: Negative for chills, fever, malaise/fatigue and weight loss.  HENT: Negative for congestion, sinus pain and sore throat.   Eyes: Negative.   Respiratory: Positive for cough, sputum production, shortness of breath and  wheezing. Negative for hemoptysis.   Cardiovascular: Negative for chest pain, palpitations, orthopnea, claudication and leg swelling.  Gastrointestinal: Negative for abdominal pain, heartburn, nausea and vomiting.  Genitourinary: Negative.   Musculoskeletal: Positive for joint pain and neck pain. Negative for myalgias.  Skin: Negative for rash.  Neurological: Negative for weakness.  Endo/Heme/Allergies: Negative.   Psychiatric/Behavioral: Negative.    Objective:   Vitals:   03/25/21 1012  BP: 124/72  Pulse: (!) 52  Temp: (!) 97.3 F (36.3 C)  TempSrc: Temporal  SpO2: 99%  Weight: 146 lb 3.2 oz (66.3 kg)  Height: _0  (1.803 m)     Physical Exam Constitutional:      General: He is not in acute distress.    Appearance: Normal appearance.  HENT:     Head: Normocephalic and atraumatic.  Eyes:     Extraocular Movements: Extraocular movements intact.     Conjunctiva/sclera: Conjunctivae normal.     Pupils: Pupils are equal, round, and reactive to light.  Neck:     Comments: Fullness along right side of neck, but no palpable lymph nodes noted Cardiovascular:     Rate and Rhythm: Normal rate and regular rhythm.     Pulses: Normal pulses.     Heart sounds: Normal heart sounds. No murmur heard.   Pulmonary:     Effort: Pulmonary effort is normal.     Breath sounds: Normal breath sounds. No wheezing or rhonchi.  Abdominal:     General: Bowel sounds are normal.     Palpations: Abdomen is soft.  Musculoskeletal:     Cervical back: Neck supple.     Right lower leg: No edema.     Left lower leg: No edema.  Lymphadenopathy:     Cervical: No cervical adenopathy.  Skin:    General: Skin is warm and dry.  Neurological:     General: No focal deficit present.     Mental Status: He is alert.  Psychiatric:        Mood and Affect: Mood normal.        Behavior: Behavior normal.        Thought Content: Thought content normal.        Judgment: Judgment normal.       CBC     Component Value Date/Time   WBC 23.0 Repeated and verified X2. (HH) 03/25/2021 1108   RBC 4.67 03/25/2021 1108   HGB 14.0 03/25/2021 1108   HCT 41.7 03/25/2021 1108   PLT 236.0 03/25/2021 1108   MCV 89.1 03/25/2021 1108   MCH 30.3 07/22/2020 1358   MCHC 33.5 03/25/2021 1108   RDW 15.3 03/25/2021 1108   LYMPHSABS 16.0 (H) 03/25/2021 1108  MONOABS 0.5 03/25/2021 1108   EOSABS 0.1 03/25/2021 1108   BASOSABS 0.1 03/25/2021 1108   BMP Latest Ref Rng & Units 03/25/2021 07/22/2020 12/10/2017  Glucose 70 - 99 mg/dL 86 75 89  BUN 6 - 23 mg/dL 21 28(H) 18  Creatinine 0.40 - 1.50 mg/dL 0.90 1.10 0.93  BUN/Creat Ratio 6 - 22 (calc) - 25(H) -  Sodium 135 - 145 mEq/L 139 142 138  Potassium 3.5 - 5.1 mEq/L 4.5 4.2 3.9  Chloride 96 - 112 mEq/L 102 106 99  CO2 19 - 32 mEq/L _0 Calcium 8.4 - 10.5 mg/dL 10.0 9.8 9.8     Chest imaging: CT Chest wo contrast 02/14/21 Airspace disease in the lower lungs bilaterally as described above, most notable in the left lower lobe where there are areas of consolidation. Findings are concerning for pneumonia.  Peribronchial thickening and mucous plugging in the lower lobe Airways.  CXR 12/23/20 Normal cardiac silhouette. Segmental airspace consolidation in the LEFT lower lobe. LEFT upper lobe is clear. RIGHT lung is clear. No acute osseous abnormality.  PFT: No flowsheet data found.  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[place]) - Plan: ANA+ENA+DNA/DS+Scl 02+VOZDGU/Y, Cyclic citrul peptide antibody, IgG, CBC with Differential, C-reactive protein, predniSONE (DELTASONE) 10 MG tablet, Comp Met (CMET), Rheumatoid factor, CK total and CKMB (cardiac)not at [facility], CK total and CKMB (cardiac)not at [facility], Rheumatoid factor, Comp Met (CMET), C-reactive protein, CBC with Differential, Cyclic citrul peptide antibody, IgG, ANA+ENA+DNA/DS+Scl 70+SjoSSA/B, Pathologist smear review, CANCELED: Pathologist smear  review  Lymphocytosis - Plan: Ambulatory referral to Interventional Radiology, Ambulatory referral to Hematology / Oncology  Discussion: Harold Benton is a 60 year old male, never smoker with history of testicular cancer who is referred to pulmonary clinic for cough.   The differential for his cough includes post infectious organizing pneumonia vs systemic inflammatory condition vs leukemia or lymphoma involvement.   Initially my concerns favored a post infectious organizing pneumonia but given his increasing WBC count along with peripheral lymphocytosis I am now more concerned for a leukemia or lymphoma. I have added on a peripheral smear for pathologist review. I have also referred him to hematology and interventional radiology for bone marrow biopsy. These plans were discussed with the patient on the phone after our visit today.   I have sent labs for workup of any systemic inflammatory condition.   For the concern of organizing pneumonia, we will treat him with oral prednisone starting at 44m daily and taper every 7 days by 166m  He is to start stiolto inhaler 2 puffs daily for mucous clearance and wheezing.     We will schedule patient for follow up in 1 month and to consider further chest imaging at that time.   >60 minutes was spent on this visit, reviewing records, developing care plan and completing documentation.   JoFreda JacksonMD [facility] Office: [phone]  Current Outpatient Medications:  .  albuterol (VENTOLIN HFA) 108 (90 Base) MCG/ACT inhaler, Inhale 2 puffs into the lungs every 6 (six) hours as needed for wheezing or shortness of breath., Disp: 8 g, Rfl: 0 .  Ascorbic Acid (VITAMIN C) 1000 MG tablet, Take 1,000 mg by mouth daily., Disp: , Rfl:  .  calcium carbonate (OS-CAL) 600 MG TABS, Take 600 mg by mouth daily., Disp: , Rfl:  .  loratadine (CLARITIN) 10 MG tablet, Take 10 mg by mouth daily., Disp: ,  Rfl:  .  Multiple Vitamin  (MULTIVITAMIN) tablet, Take 1 tablet by mouth daily., Disp: , Rfl:  .  predniSONE (DELTASONE) 10 MG tablet, Take 1 tablet (10 mg total) by mouth daily with breakfast., Disp: 90 tablet, Rfl: 0 .  sildenafil (REVATIO) 20 MG tablet, TAKE 2-5 TABLETS BY MOUTH 1 HOUR PIRO TO SEXUAL ACTIVITY, Disp: 50 tablet, Rfl: 5 .  Tiotropium Bromide-Olodaterol (STIOLTO RESPIMAT) 2.5-2.5 MCG/ACT AERS, Inhale 2 puffs into the lungs daily., Disp: 4 g, Rfl: 0

## 2021-03-26 LAB — PATHOLOGIST SMEAR REVIEW

## 2021-03-26 MED ORDER — PREDNISONE 10 MG PO TABS: ORAL_TABLET | ORAL | 0 refills | Status: DC

## 2021-03-26 NOTE — Telephone Encounter (Signed)
Called and spoke with pharmacist at [facility]. She stated that the prescription that was sent yesterday did not have the taper instructions on it, therefore he was only able to receive 30 tablets. Advised her that I would send in 60 tablets with the tamper instructions. She verbalized understanding.

## 2021-03-26 NOTE — Addendum Note (Signed)
Addended by: Amado Coe on: 03/26/2021 10:32 AM   Modules accepted: Orders

## 2021-03-27 ENCOUNTER — Telehealth: Payer: Self-pay | Admitting: Hematology and Oncology

## 2021-03-27 ENCOUNTER — Inpatient Hospital Stay: Payer: 59

## 2021-03-27 ENCOUNTER — Other Ambulatory Visit: Payer: Self-pay

## 2021-03-27 ENCOUNTER — Inpatient Hospital Stay: Payer: 59 | Attending: Hematology and Oncology | Admitting: Hematology and Oncology

## 2021-03-27 ENCOUNTER — Encounter: Payer: Self-pay | Admitting: Hematology and Oncology

## 2021-03-27 VITALS — BP 160/74 | HR 78 | Temp 97.5°F | Resp 16 | Ht 71.0 in | Wt 146.0 lb

## 2021-03-27 DIAGNOSIS — Z8547 Personal history of malignant neoplasm of testis: Secondary | ICD-10-CM | POA: Diagnosis not present

## 2021-03-27 DIAGNOSIS — D72820 Lymphocytosis (symptomatic): Secondary | ICD-10-CM | POA: Diagnosis not present

## 2021-03-27 DIAGNOSIS — C9110 Chronic lymphocytic leukemia of B-cell type not having achieved remission: Secondary | ICD-10-CM | POA: Insufficient documentation

## 2021-03-27 LAB — CMP (CANCER CENTER ONLY)
ALT: 27 U/L (ref 0–44)
AST: 27 U/L (ref 15–41)
Albumin: 3.9 g/dL (ref 3.5–5.0)
Alkaline Phosphatase: 85 U/L (ref 38–126)
Anion gap: 10 (ref 5–15)
BUN: 25 mg/dL — ABNORMAL HIGH (ref 6–20)
CO2: 27 mmol/L (ref 22–32)
Calcium: 9.6 mg/dL (ref 8.9–10.3)
Chloride: 107 mmol/L (ref 98–111)
Creatinine: 0.87 mg/dL (ref 0.61–1.24)
GFR, Estimated: >60 mL/min (ref >60)
Glucose, Bld: 88 mg/dL (ref 70–99)
Potassium: 4 mmol/L (ref 3.5–5.1)
Sodium: 144 mmol/L (ref 135–145)
Total Bilirubin: 0.7 mg/dL (ref 0.3–1.2)
Total Protein: 6.9 g/dL (ref 6.5–8.1)

## 2021-03-27 LAB — CBC WITH DIFFERENTIAL/PLATELET
Abs Immature Granulocytes: 0.07 10*3/uL (ref 0.00–0.07)
Basophils Absolute: 0.1 10*3/uL (ref 0.0–0.1)
Basophils Relative: 0 %
Eosinophils Absolute: 0 10*3/uL (ref 0.0–0.5)
Eosinophils Relative: 0 %
HCT: 39 % (ref 39.0–52.0)
Hemoglobin: 12.9 g/dL — ABNORMAL LOW (ref 13.0–17.0)
Immature Granulocytes: 0 %
Lymphocytes Relative: 66 %
Lymphs Abs: 18 10*3/uL — ABNORMAL HIGH (ref 0.7–4.0)
MCH: 29.5 pg (ref 26.0–34.0)
MCHC: 33.1 g/dL (ref 30.0–36.0)
MCV: 89 fL (ref 80.0–100.0)
Monocytes Absolute: 0.9 10*3/uL (ref 0.1–1.0)
Monocytes Relative: 3 %
Neutro Abs: 8.4 10*3/uL — ABNORMAL HIGH (ref 1.7–7.7)
Neutrophils Relative %: 31 %
Platelets: 232 10*3/uL (ref 150–400)
RBC: 4.38 MIL/uL (ref 4.22–5.81)
RDW: 14.7 % (ref 11.5–15.5)
WBC: 27.4 10*3/uL — ABNORMAL HIGH (ref 4.0–10.5)
nRBC: 0 % (ref 0.0–0.2)

## 2021-03-27 LAB — SURGICAL PATHOLOGY

## 2021-03-27 LAB — LACTATE DEHYDROGENASE: LDH: 127 U/L (ref 98–192)

## 2021-03-27 NOTE — Assessment & Plan Note (Signed)
Lymphocytosis refers to an increase of peripheral blood lymphocytes, which for adults corresponds to >4000 lymphocytes/microL in most clinical laboratories. Common causes of lymphocytosis are viral illness, drug hypersensitivity reaction, stress, polyclonal B cell lymphocytosis in people who smoke, asplenia, and some clonal B cell process including lymphomas and leukemia. Initial evaluation usually consists of CBC, CMP, LDH and flow cytometry. 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 I have discussed about staging of CLL, I do not see an immediate indication for bone marrow aspiration and biopsy, this can be canceled. I have also discussed about indications for treatment if CLL is indeed confirmed. We will proceed with FISH testing when he comes back for his follow-up visit, IGH V mutation analysis to stratify the risk.  At this time, even if he were to have CLL, I do not see any treatment unless he continues to have recurrent infections.   He is agreeable to all these recommendations.  He will return to clinic in about 2 to 3 weeks to review labs and to discuss any additional concerns.

## 2021-03-27 NOTE — Telephone Encounter (Signed)
Patient will call once he cancels his appts on 6/6 to schedule his f/u with Dr. Chryl Heck.

## 2021-03-27 NOTE — Progress Notes (Signed)
[place] NOTE  Patient Care Team: Eulas Post, MD as PCP - General  CHIEF COMPLAINTS/PURPOSE OF CONSULTATION:  Lymphocytosis, initial evaluation.  ASSESSMENT & PLAN:  Lymphocytosis Lymphocytosis refers to an increase of peripheral blood lymphocytes, which for adults corresponds to >4000 lymphocytes/microL in most clinical laboratories. Common causes of lymphocytosis are viral illness, drug hypersensitivity reaction, stress, polyclonal B cell lymphocytosis in people who smoke, asplenia, and some clonal B cell process including lymphomas and leukemia. Initial evaluation usually consists of CBC, CMP, LDH and flow cytometry. 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 I have discussed about staging of CLL, I do not see an immediate indication for bone marrow aspiration and biopsy, this can be canceled. I have also discussed about indications for treatment if CLL is indeed confirmed. We will proceed with FISH testing when he comes back for his follow-up visit, IGH V mutation analysis to stratify the risk.  At this time, even if he were to have CLL, I do not see any treatment unless he continues to have recurrent infections.   He is agreeable to all these recommendations.  He will return to clinic in about 2 to 3 weeks to review labs and to discuss any additional concerns.   Orders Placed This Encounter  Procedures  . CBC with Differential/Platelet    Standing Status:   Standing    Number of Occurrences:   22    Standing Expiration Date:   03/27/2022  . CMP ([place] only)    Standing Status:   Future    Number of Occurrences:   1    Standing Expiration Date:   03/27/2022  . Lactate dehydrogenase    Standing Status:   Future    Number of Occurrences:   1    Standing Expiration Date:   03/27/2022  . Flow Cytometry, Peripheral Blood (Oncology)    Standing Status:   Future    Number of  Occurrences:   1    Standing Expiration Date:   03/27/2022     HISTORY OF PRESENTING ILLNESS:  Harold Benton 60 y.o. male is here because of lymphocytosis.  This is a very pleasant 60 year old male patient with past medical history significant for testicular cancer, recent pneumonia in February 2022 with multiple courses of antibiotics referred to hematology for evaluation of lymphocytosis.  He denies any fatigue, fevers, drenching night sweats, loss of appetite or loss of weight.  He denies any recurrent infections except for an infection since February 2022.  No change in bowel habits or urinary habits.  He is a runner at baseline, used to run about 4 miles a day, now getting back to running, can run about a mile before he gets some leg cramps.  For testicular cancer, he does not remember the exact regimen but he remembers getting bleomycin and cisplatin, also had retroperitoneal lymphadenectomy along with orchiectomy.  His father had CLL so he appears to be familiar with the disease.  His father passed away at the age of 76 with that, not necessarily from CLL. Rest of the pertinent 10 point ROS reviewed and negative.  REVIEW OF SYSTEMS:   Constitutional: Denies fevers, chills or abnormal night sweats Eyes: Denies blurriness of vision, double vision or watery eyes Ears, nose, mouth, throat, and face: Denies mucositis or sore throat Respiratory: As mentioned above Cardiovascular: Denies palpitation, chest discomfort or lower extremity swelling Gastrointestinal:  Denies nausea, heartburn or  change in bowel habits Skin: Denies abnormal skin rashes Lymphatics: Denies new lymphadenopathy or easy bruising Neurological:Denies numbness, tingling or new weaknesses Behavioral/Psych: Mood is stable, no new changes  All other systems were reviewed with the patient and are negative.  MEDICAL HISTORY:  Past Medical History:  Diagnosis Date  . Testicular cancer ([place]) 1984    SURGICAL HISTORY: Past  Surgical History:  Procedure Laterality Date  . EXPLORATORY LAPAROTOMY  1984  . tecticle removal  1984    SOCIAL HISTORY: Social History   Socioeconomic History  . Marital status: Married    Spouse name: Not on file  . Number of children: Not on file  . Years of education: Not on file  . Highest education level: Not on file  Occupational History  . Not on file  Tobacco Use  . Smoking status: Never Smoker  . Smokeless tobacco: Never Used  Substance and Sexual Activity  . Alcohol use: Yes    Alcohol/week: 6.0 standard drinks    Types: 6 Cans of beer per week  . Drug use: No  . Sexual activity: Not on file  Other Topics Concern  . Not on file  Social History Narrative  . Not on file   Social Determinants of Health   Financial Resource Strain: Not on file  Food Insecurity: Not on file  Transportation Needs: Not on file  Physical Activity: Not on file  Stress: Not on file  Social Connections: Not on file  Intimate Partner Violence: Not on file    FAMILY HISTORY: Family History  Problem Relation Age of Onset  . Cancer Mother        ?type  . Cancer Father        CLL  . Colon cancer Neg Hx   . Stomach cancer Neg Hx     ALLERGIES:  has No Known Allergies.  MEDICATIONS:  Current Outpatient Medications  Medication Sig Dispense Refill  . albuterol (VENTOLIN HFA) 108 (90 Base) MCG/ACT inhaler Inhale 2 puffs into the lungs every 6 (six) hours as needed for wheezing or shortness of breath. 8 g 0  . Ascorbic Acid (VITAMIN C) 1000 MG tablet Take 1,000 mg by mouth daily.    . calcium carbonate (OS-CAL) 600 MG TABS Take 600 mg by mouth daily.    Marland Kitchen loratadine (CLARITIN) 10 MG tablet Take 10 mg by mouth daily.    . Multiple Vitamin (MULTIVITAMIN) tablet Take 1 tablet by mouth daily.    . predniSONE (DELTASONE) 10 MG tablet Take 1 tablet (10 mg total) by mouth daily with breakfast. 90 tablet 0  . predniSONE (DELTASONE) 10 MG tablet $RemoveB'40mg'ySxfNvDZ$  daily for 7 days, $Remov'30mg'QzQcmx$  daily for 7  days, $Remo'20mg'xGoRE$  daily for 7 days, $Remov'10mg'qXOhaK$  daily for 7 days, $Remov'5mg'kBZGNM$  daily for 7 days 60 tablet 0  . sildenafil (REVATIO) 20 MG tablet TAKE 2-5 TABLETS BY MOUTH 1 HOUR PIRO TO SEXUAL ACTIVITY 50 tablet 5  . Tiotropium Bromide-Olodaterol (STIOLTO RESPIMAT) 2.5-2.5 MCG/ACT AERS Inhale 2 puffs into the lungs daily. 4 g 0   No current facility-administered medications for this visit.    PHYSICAL EXAMINATION: ECOG PERFORMANCE STATUS: 0 - Asymptomatic  Vitals:   03/27/21 1050  BP: (!) 160/74  Pulse: 78  Resp: 16  Temp: (!) 97.5 F (36.4 C)  SpO2: 100%   Filed Weights   03/27/21 1050  Weight: 146 lb (66.2 kg)    GENERAL:alert, no distress and comfortable SKIN: skin color, texture, turgor are normal, no rashes or significant  lesions EYES: normal, conjunctiva are pink and non-injected, sclera clear OROPHARYNX:no exudate, no erythema and lips, buccal mucosa, and tongue normal  NECK: supple, thyroid normal size, non-tender, without nodularity LYMPH:  no palpable lymphadenopathy in the cervical, axillary  LUNGS: clear to auscultation and percussion with normal breathing effort HEART: regular rate & rhythm and no murmurs and no lower extremity edema ABDOMEN:abdomen soft, non-tender and normal bowel sounds Musculoskeletal:no cyanosis of digits and no clubbing  PSYCH: alert & oriented x 3 with fluent speech NEURO: no focal motor/sensory deficits  LABORATORY DATA:  I have reviewed the data as listed Lab Results  Component Value Date   WBC 27.4 (H) 03/27/2021   HGB 12.9 (L) 03/27/2021   HCT 39.0 03/27/2021   MCV 89.0 03/27/2021   PLT 232 03/27/2021     Chemistry      Component Value Date/Time   NA 144 03/27/2021 1133   K 4.0 03/27/2021 1133   CL 107 03/27/2021 1133   CO2 27 03/27/2021 1133   BUN 25 (H) 03/27/2021 1133   CREATININE 0.87 03/27/2021 1133   CREATININE 1.10 07/22/2020 1358      Component Value Date/Time   CALCIUM 9.6 03/27/2021 1133   ALKPHOS 85 03/27/2021 1133   AST 27  03/27/2021 1133   ALT 27 03/27/2021 1133   BILITOT 0.7 03/27/2021 1133     I have reviewed his labs for the past several years.  He appears to have had progressive increase in lymphocytosis at least for the past year or year and a half.  No anemia or thrombocytopenia.  RADIOGRAPHIC STUDIES: I have personally reviewed the radiological images as listed and agreed with the findings in the report. No results found.  All questions were answered. The patient knows to call the clinic with any problems, questions or concerns. I spent 45 minutes in the care of this patient including H and P, review of records, counseling and coordination of care. We have discussed about common causes of lymphocytosis including viral infections, autoimmune diseases, medications, bone marrow disorder such as CLL.  We have discussed that the working diagnosis is a CLL, recommended flow cytometry for evaluation.  Briefly discussed about symptoms of CLL, indications for treatment and available treatment is an excellent prognosis in most cases of CLL.    Benay Pike, MD 03/27/2021 1:57 PM

## 2021-03-28 LAB — ANA+ENA+DNA/DS+SCL 70+SJOSSA/B
ANA Titer 1: NEGATIVE
ENA RNP Ab: <0.2 AI (ref 0.0–0.9)
ENA SM Ab Ser-aCnc: <0.2 AI (ref 0.0–0.9)
ENA SSA (RO) Ab: <0.2 AI (ref 0.0–0.9)
ENA SSB (LA) Ab: <0.2 AI (ref 0.0–0.9)
Scleroderma (Scl-70) (ENA) Antibody, IgG: <0.2 AI (ref 0.0–0.9)
dsDNA Ab: <1 IU/mL (ref 0–9)

## 2021-03-28 LAB — RHEUMATOID FACTOR: Rheumatoid fact SerPl-aCnc: <14 IU/mL (ref <14)

## 2021-03-28 LAB — CK TOTAL AND CKMB (NOT AT ARMC): Total CK: 52 U/L (ref 44–196)

## 2021-03-28 LAB — CYCLIC CITRUL PEPTIDE ANTIBODY, IGG: Cyclic Citrullin Peptide Ab: <16 UNITS

## 2021-04-01 ENCOUNTER — Encounter: Payer: Self-pay | Admitting: Hematology and Oncology

## 2021-04-01 LAB — FLOW CYTOMETRY

## 2021-04-07 ENCOUNTER — Inpatient Hospital Stay: Payer: 59

## 2021-04-07 ENCOUNTER — Telehealth: Payer: Self-pay | Admitting: Hematology and Oncology

## 2021-04-07 ENCOUNTER — Other Ambulatory Visit: Payer: Self-pay

## 2021-04-07 ENCOUNTER — Ambulatory Visit (HOSPITAL_COMMUNITY): Payer: 59

## 2021-04-07 ENCOUNTER — Inpatient Hospital Stay: Payer: 59 | Attending: Hematology and Oncology | Admitting: Hematology and Oncology

## 2021-04-07 ENCOUNTER — Encounter: Payer: Self-pay | Admitting: Hematology and Oncology

## 2021-04-07 VITALS — BP 136/74 | HR 54 | Temp 98.1°F | Resp 17 | Wt 145.1 lb

## 2021-04-07 DIAGNOSIS — Z8701 Personal history of pneumonia (recurrent): Secondary | ICD-10-CM | POA: Diagnosis not present

## 2021-04-07 DIAGNOSIS — C9110 Chronic lymphocytic leukemia of B-cell type not having achieved remission: Secondary | ICD-10-CM | POA: Insufficient documentation

## 2021-04-07 DIAGNOSIS — Z8547 Personal history of malignant neoplasm of testis: Secondary | ICD-10-CM | POA: Diagnosis not present

## 2021-04-07 DIAGNOSIS — D72820 Lymphocytosis (symptomatic): Secondary | ICD-10-CM | POA: Diagnosis present

## 2021-04-07 LAB — CBC WITH DIFFERENTIAL/PLATELET
Abs Immature Granulocytes: 0 10*3/uL (ref 0.00–0.07)
Band Neutrophils: 1 %
Basophils Absolute: 0 10*3/uL (ref 0.0–0.1)
Basophils Relative: 0 %
Eosinophils Absolute: 0.3 10*3/uL (ref 0.0–0.5)
Eosinophils Relative: 1 %
HCT: 39.4 % (ref 39.0–52.0)
Hemoglobin: 12.9 g/dL — ABNORMAL LOW (ref 13.0–17.0)
Lymphocytes Relative: 82 %
Lymphs Abs: 23.4 10*3/uL — ABNORMAL HIGH (ref 0.7–4.0)
MCH: 29.4 pg (ref 26.0–34.0)
MCHC: 32.7 g/dL (ref 30.0–36.0)
MCV: 89.7 fL (ref 80.0–100.0)
Monocytes Absolute: 0.3 10*3/uL (ref 0.1–1.0)
Monocytes Relative: 1 %
Neutro Abs: 4.6 10*3/uL (ref 1.7–7.7)
Neutrophils Relative %: 15 %
Platelets: 173 10*3/uL (ref 150–400)
RBC: 4.39 MIL/uL (ref 4.22–5.81)
RDW: 15.5 % (ref 11.5–15.5)
WBC: 28.5 10*3/uL — ABNORMAL HIGH (ref 4.0–10.5)
nRBC: 0 % (ref 0.0–0.2)

## 2021-04-07 NOTE — Progress Notes (Signed)
[facility] NOTE  Patient Care Team: Eulas Post, MD as PCP - General  CHIEF COMPLAINTS/PURPOSE OF CONSULTATION:  Lymphocytosis, initial evaluation.  ASSESSMENT & PLAN:  CLL (chronic lymphocytic leukemia) (HCC) CLL/SLL is the most common leukemia in adults in [place] countries, accounting for approximately 25 to 35 percent of all leukemias in the [place]. CLL/SLL is considered to be mainly a disease of older adults, with a median age at diagnosis of approximately 25 years. Not all patient with CLL need treatment. Treatment is indicated for patients with following disease related complications termed active disease.  1. Evidence of progressive marrow failure as manifested by development of worsening anemia or thrombocytopenia. Hemoglobin <10 g/dL or platelet count <100,000/microL are generally regarded as indications for treatment. However, platelet counts <100,000/microL may remain stable over a long period of time in some patients, and this situation does not automatically require treatment. 2. Massive or progressive splenomegaly 3. Massive or progressive or symptomatic lymphadenopathy 4. Progressive lymphocytosis with an increase of > 50 % over two month period of LDT of less than 6 months ( softer indication) 5. Constitutional symptoms. 6. Symptomatic or functional extranodal involvement.  We have discussed monitoring in patients without active disease every 3/4 months with repeat labs. Patient is agreeable to these recommendations. CLL FISH and IGHV mutation analysis ordered to risk stratify. He is mostly at least intermediate risk given ALC. He will RTC in 3 months. He was encouraged to contact us with any new questions or concerns.        Orders Placed This Encounter  Procedures  . FISH, CLL Prognostic Panel    Standing Status:   Future    Number of Occurrences:   1    Standing Expiration Date:   04/07/2022  . IgVH Somatic Hypermutation     Standing Status:   Future    Number of Occurrences:   1    Standing Expiration Date:   04/07/2022  . CBC with Differential/Platelet    Standing Status:   Standing    Number of Occurrences:   22    Standing Expiration Date:   04/07/2022    HISTORY OF PRESENTING ILLNESS:   Harold Benton 60 y.o. male is here because of lymphocytosis.  This is a very pleasant 60 year old male patient with past medical history significant for testicular cancer, recent pneumonia in February 2022 with multiple courses of antibiotics referred to hematology for evaluation of lymphocytosis.    Interim History  Since last visit, he continues on prednisone taper. He still tries to run, gets leg cramps when he runs a mile. No other B symptoms. He is following up with pulmonology and PCP No other complaints for me today.  REVIEW OF SYSTEMS:   Constitutional: Denies fevers, chills or abnormal night sweats Eyes: Denies blurriness of vision, double vision or watery eyes Ears, nose, mouth, throat, and face: Denies mucositis or sore throat Respiratory: As mentioned above Cardiovascular: Denies palpitation, chest discomfort or lower extremity swelling Gastrointestinal:  Denies nausea, heartburn or change in bowel habits Skin: Denies abnormal skin rashes Lymphatics: Denies new lymphadenopathy or easy bruising Neurological:Denies numbness, tingling or new weaknesses Behavioral/Psych: Mood is stable, no new changes  All other systems were reviewed with the patient and are negative.  MEDICAL HISTORY:  Past Medical History:  Diagnosis Date  . Testicular cancer ([place]) 1984    SURGICAL HISTORY: Past Surgical History:  Procedure Laterality Date  . EXPLORATORY LAPAROTOMY  1984  . tecticle  removal  1984    SOCIAL HISTORY: Social History   Socioeconomic History  . Marital status: Married    Spouse name: Not on file  . Number of children: Not on file  . Years of education: Not on file  . Highest education  level: Not on file  Occupational History  . Not on file  Tobacco Use  . Smoking status: Never Smoker  . Smokeless tobacco: Never Used  Substance and Sexual Activity  . Alcohol use: Yes    Alcohol/week: 6.0 standard drinks    Types: 6 Cans of beer per week  . Drug use: No  . Sexual activity: Not on file  Other Topics Concern  . Not on file  Social History Narrative  . Not on file   Social Determinants of Health   Financial Resource Strain: Not on file  Food Insecurity: Not on file  Transportation Needs: Not on file  Physical Activity: Not on file  Stress: Not on file  Social Connections: Not on file  Intimate Partner Violence: Not on file    FAMILY HISTORY: Family History  Problem Relation Age of Onset  . Cancer Mother        ?type  . Cancer Father        CLL  . Colon cancer Neg Hx   . Stomach cancer Neg Hx     ALLERGIES:  has No Known Allergies.  MEDICATIONS:  Current Outpatient Medications  Medication Sig Dispense Refill  . albuterol (VENTOLIN HFA) 108 (90 Base) MCG/ACT inhaler Inhale 2 puffs into the lungs every 6 (six) hours as needed for wheezing or shortness of breath. 8 g 0  . Ascorbic Acid (VITAMIN C) 1000 MG tablet Take 1,000 mg by mouth daily.    . calcium carbonate (OS-CAL) 600 MG TABS Take 600 mg by mouth daily.    Marland Kitchen loratadine (CLARITIN) 10 MG tablet Take 10 mg by mouth daily.    . Multiple Vitamin (MULTIVITAMIN) tablet Take 1 tablet by mouth daily.    . predniSONE (DELTASONE) 10 MG tablet 40mg  daily for 7 days, 30mg  daily for 7 days, 20mg  daily for 7 days, 10mg  daily for 7 days, 5mg  daily for 7 days 60 tablet 0  . sildenafil (REVATIO) 20 MG tablet TAKE 2-5 TABLETS BY MOUTH 1 HOUR PIRO TO SEXUAL ACTIVITY 50 tablet 5  . Tiotropium Bromide-Olodaterol (STIOLTO RESPIMAT) 2.5-2.5 MCG/ACT AERS Inhale 2 puffs into the lungs daily. 4 g 0  . predniSONE (DELTASONE) 10 MG tablet Take 1 tablet (10 mg total) by mouth daily with breakfast. 90 tablet 0   No current  facility-administered medications for this visit.    PHYSICAL EXAMINATION: ECOG PERFORMANCE STATUS: 0 - Asymptomatic  Vitals:   04/07/21 0935  BP: 136/74  Pulse: (!) 54  Resp: 17  Temp: 98.1 F (36.7 C)  SpO2: 100%   Filed Weights   04/07/21 0935  Weight: 145 lb 1.6 oz (65.8 kg)    PE deferred in lieu of counseling  LABORATORY DATA:  I have reviewed the data as listed Lab Results  Component Value Date   WBC 28.5 (H) 04/07/2021   HGB 12.9 (L) 04/07/2021   HCT 39.4 04/07/2021   MCV 89.7 04/07/2021   PLT 173 04/07/2021     Chemistry      Component Value Date/Time   NA 144 03/27/2021 1133   K 4.0 03/27/2021 1133   CL 107 03/27/2021 1133   CO2 27 03/27/2021 1133   BUN 25 (H) 03/27/2021  1133   CREATININE 0.87 03/27/2021 1133   CREATININE 1.10 07/22/2020 1358      Component Value Date/Time   CALCIUM 9.6 03/27/2021 1133   ALKPHOS 85 03/27/2021 1133   AST 27 03/27/2021 1133   ALT 27 03/27/2021 1133   BILITOT 0.7 03/27/2021 1133     Flow consistent with CLL  RADIOGRAPHIC STUDIES: I have personally reviewed the radiological images as listed and agreed with the findings in the report. No results found.  All questions were answered. The patient knows to call the clinic with any problems, questions or concerns. I spent 30 minutes in the care of this patient, reviewed labs, discussed definition of active disease, indications for treatment, stratifying risk to predict time to first treatment, symptoms to watch for and surveillance recommendations.    Benay Pike, MD 04/07/2021 12:43 PM

## 2021-04-07 NOTE — Assessment & Plan Note (Addendum)
CLL/SLL is the most common leukemia in adults in [place] countries, accounting for approximately 25 to 35 percent of all leukemias in the [place]. CLL/SLL is considered to be mainly a disease of older adults, with a median age at diagnosis of approximately 16 years. Not all patient with CLL need treatment. Treatment is indicated for patients with following disease related complications termed active disease.  1. Evidence of progressive marrow failure as manifested by development of worsening anemia or thrombocytopenia. Hemoglobin <10 g/dL or platelet count <100,000/microL are generally regarded as indications for treatment. However, platelet counts <100,000/microL may remain stable over a long period of time in some patients, and this situation does not automatically require treatment. 2. Massive or progressive splenomegaly 3. Massive or progressive or symptomatic lymphadenopathy 4. Progressive lymphocytosis with an increase of > 50 % over two month period of LDT of less than 6 months ( softer indication) 5. Constitutional symptoms. 6. Symptomatic or functional extranodal involvement.  We have discussed monitoring in patients without active disease every 3/4 months with repeat labs. Patient is agreeable to these recommendations. CLL FISH and IGHV mutation analysis ordered to risk stratify. He is mostly at least intermediate risk given ALC. He will RTC in 3 months. He was encouraged to contact us with any new questions or concerns.

## 2021-04-07 NOTE — Telephone Encounter (Signed)
Scheduled appointment per 06/06 sch msg. Patient is aware. 

## 2021-04-15 LAB — FISH,CLL PROGNOSTIC PANEL

## 2021-04-16 LAB — IGVH SOMATIC HYPERMUTATION

## 2021-04-28 ENCOUNTER — Other Ambulatory Visit (HOSPITAL_COMMUNITY)
Admission: RE | Admit: 2021-04-28 | Discharge: 2021-04-28 | Disposition: A | Discharge status: Home / Self Care | Payer: 59 | Source: Ambulatory Visit | Attending: Pulmonary Disease | Admitting: Pulmonary Disease

## 2021-04-28 ENCOUNTER — Encounter: Payer: Self-pay | Admitting: Hematology and Oncology

## 2021-04-28 ENCOUNTER — Encounter: Payer: Self-pay | Admitting: Family Medicine

## 2021-04-28 DIAGNOSIS — U071 COVID-19: Secondary | ICD-10-CM | POA: Insufficient documentation

## 2021-04-28 DIAGNOSIS — Z01812 Encounter for preprocedural laboratory examination: Secondary | ICD-10-CM | POA: Diagnosis not present

## 2021-04-28 LAB — SARS CORONAVIRUS 2 (TAT 6-24 HRS): SARS Coronavirus 2: POSITIVE — AB

## 2021-04-28 MED ORDER — NIRMATRELVIR/RITONAVIR (PAXLOVID)TABLET: 3.0000 | ORAL_TABLET | Freq: Two times a day (BID) | ORAL | 0 refills | Status: DC

## 2021-04-28 MED ORDER — PAXLOVID 20 X 150 MG & 10 X 100MG PO TBPK: 3.0000 | ORAL_TABLET | Freq: Two times a day (BID) | ORAL | 0 refills | Status: DC

## 2021-04-28 NOTE — Telephone Encounter (Signed)
Dr. Erin Fulling,   Patient you these 2 messages below. Please advise.    Hi Dr. Erin Fulling, In preparation for my appointment with you on Thur-June-30, I took a COVID test over at the [facility] facility in [place].  Haven't received the results yet, but had a suspicion that I may be positive.  Took a home test earlier today that was in fact positive. Assuming I'll need to cancel the appt. and breathing test Thur. Based on my CLL diagnosis, should I take any special precautions regarding COVID? Also, I ended up with extra Prednisone tablets, so I've just continued taking 2 a day.  Will run out on [place]. Please give me a call if necessary. Thanks, Harold Benton [phone]  No fever.  My head feels a bit "stuffy", have a bit of a dry cough, and a small amount of lower back pain/stiffness..  Overall, feel fairly good.  If I didn't know I  had COVID, these symptoms would not prevent me from doing most of what I would want to do. Yes, I have a smart phone.  No, I've never done  myChart visit, but have had my share of Zoom and Teams meetings. Thank you, [facility] routed to Dr. Erin Fulling to advise

## 2021-05-01 ENCOUNTER — Ambulatory Visit: Payer: 59 | Admitting: Pulmonary Disease

## 2021-05-16 ENCOUNTER — Encounter: Payer: Self-pay | Admitting: Hematology and Oncology

## 2021-05-16 ENCOUNTER — Other Ambulatory Visit: Payer: Self-pay

## 2021-05-16 DIAGNOSIS — D72820 Lymphocytosis (symptomatic): Secondary | ICD-10-CM

## 2021-05-16 DIAGNOSIS — C9110 Chronic lymphocytic leukemia of B-cell type not having achieved remission: Secondary | ICD-10-CM

## 2021-05-19 ENCOUNTER — Inpatient Hospital Stay: Payer: 59 | Attending: Hematology and Oncology

## 2021-05-19 ENCOUNTER — Other Ambulatory Visit: Payer: Self-pay

## 2021-05-19 DIAGNOSIS — C9110 Chronic lymphocytic leukemia of B-cell type not having achieved remission: Secondary | ICD-10-CM | POA: Diagnosis present

## 2021-05-19 LAB — CBC WITH DIFFERENTIAL (CANCER CENTER ONLY)
Abs Immature Granulocytes: 0.02 10*3/uL (ref 0.00–0.07)
Basophils Absolute: 0.1 10*3/uL (ref 0.0–0.1)
Basophils Relative: 0 %
Eosinophils Absolute: 0.1 10*3/uL (ref 0.0–0.5)
Eosinophils Relative: 1 %
HCT: 40.2 % (ref 39.0–52.0)
Hemoglobin: 13.3 g/dL (ref 13.0–17.0)
Immature Granulocytes: 0 %
Lymphocytes Relative: 78 %
Lymphs Abs: 11.8 10*3/uL — ABNORMAL HIGH (ref 0.7–4.0)
MCH: 29.8 pg (ref 26.0–34.0)
MCHC: 33.1 g/dL (ref 30.0–36.0)
MCV: 89.9 fL (ref 80.0–100.0)
Monocytes Absolute: 1 10*3/uL (ref 0.1–1.0)
Monocytes Relative: 7 %
Neutro Abs: 2.1 10*3/uL (ref 1.7–7.7)
Neutrophils Relative %: 14 %
Platelet Count: 127 10*3/uL — ABNORMAL LOW (ref 150–400)
RBC: 4.47 MIL/uL (ref 4.22–5.81)
RDW: 15.3 % (ref 11.5–15.5)
WBC Count: 15 10*3/uL — ABNORMAL HIGH (ref 4.0–10.5)
nRBC: 0 % (ref 0.0–0.2)

## 2021-05-19 LAB — CMP (CANCER CENTER ONLY)
ALT: 26 U/L (ref 0–44)
AST: 26 U/L (ref 15–41)
Albumin: 4.1 g/dL (ref 3.5–5.0)
Alkaline Phosphatase: 80 U/L (ref 38–126)
Anion gap: 10 (ref 5–15)
BUN: 20 mg/dL (ref 6–20)
CO2: 26 mmol/L (ref 22–32)
Calcium: 9.4 mg/dL (ref 8.9–10.3)
Chloride: 107 mmol/L (ref 98–111)
Creatinine: 0.97 mg/dL (ref 0.61–1.24)
GFR, Estimated: >60 mL/min (ref >60)
Glucose, Bld: 87 mg/dL (ref 70–99)
Potassium: 3.9 mmol/L (ref 3.5–5.1)
Sodium: 143 mmol/L (ref 135–145)
Total Bilirubin: 0.8 mg/dL (ref 0.3–1.2)
Total Protein: 6.8 g/dL (ref 6.5–8.1)

## 2021-05-19 LAB — LACTATE DEHYDROGENASE: LDH: 169 U/L (ref 98–192)

## 2021-06-13 ENCOUNTER — Encounter: Payer: Self-pay | Admitting: Family Medicine

## 2021-06-23 NOTE — Telephone Encounter (Signed)
Please advise on patient mychart message  Hi Dr. Erin Fulling, [facility] you're doing well!   Last Wed., Aug. 17th, I started having a sore throat and some sinus drainage.  Just to be sure, I took a Covid home test on both Thur-Aug-18 & Fri-Aug-19 mornings; both were negative I immediately started taking Advil Cold & Sinus and Mucinex, which I'm still taking. This is the normal tracking for me when I get a cold or worse.  I feel this is starting to move into my chest/lungs.  Do you have any suggestions for me that may stop the progress of this episode.  After this past Feb./Mar./Apr., I definitely don't want to get back into a pneumonia situation if I can help it. Thanks! [place] [phone]

## 2021-06-24 MED ORDER — AMOXICILLIN-POT CLAVULANATE 875-125 MG PO TABS: 1.0000 | ORAL_TABLET | Freq: Two times a day (BID) | ORAL | 0 refills | Status: DC

## 2021-07-09 NOTE — Telephone Encounter (Signed)
Received the following message from patient:   "Hi Dr. Erin Fulling, I finished the Augmentin prescription as scheduled on Aug.  30.  Feeling good now; just an occasional clearing of the throat, but otherwise no congestion. I was scheduled to have a breathing test back in late June.  But I tested positive for Covid in early June.  I understand that the protocol is for a 3 month period of no Covid before that test can be administered. Do you think I should still have the breathing test?  When I'm really exerting myself, like running or playing singles tennis, I definitely get short-winded faster that I previously did.  But that may likely be that I'm just not in as good as shape yet as I was. Please let me know what you think. I have an appointment with Dr. Chryl Heck Mon-Sept-12 for blood work to check on the CLL."  JD, can you please advise. Thanks!

## 2021-07-14 ENCOUNTER — Inpatient Hospital Stay: Payer: 59 | Attending: Hematology and Oncology | Admitting: Hematology and Oncology

## 2021-07-14 ENCOUNTER — Inpatient Hospital Stay: Payer: 59

## 2021-07-14 ENCOUNTER — Encounter: Payer: Self-pay | Admitting: Hematology and Oncology

## 2021-07-14 ENCOUNTER — Other Ambulatory Visit: Payer: Self-pay

## 2021-07-14 VITALS — BP 139/79 | HR 49 | Temp 97.8°F | Resp 17 | Wt 146.6 lb

## 2021-07-14 DIAGNOSIS — C9110 Chronic lymphocytic leukemia of B-cell type not having achieved remission: Secondary | ICD-10-CM

## 2021-07-14 DIAGNOSIS — R161 Splenomegaly, not elsewhere classified: Secondary | ICD-10-CM | POA: Diagnosis not present

## 2021-07-14 DIAGNOSIS — Z8547 Personal history of malignant neoplasm of testis: Secondary | ICD-10-CM | POA: Insufficient documentation

## 2021-07-14 DIAGNOSIS — Z8701 Personal history of pneumonia (recurrent): Secondary | ICD-10-CM | POA: Diagnosis not present

## 2021-07-14 LAB — COMPREHENSIVE METABOLIC PANEL
ALT: 20 U/L (ref 0–44)
AST: 22 U/L (ref 15–41)
Albumin: 4 g/dL (ref 3.5–5.0)
Alkaline Phosphatase: 80 U/L (ref 38–126)
Anion gap: 8 (ref 5–15)
BUN: 29 mg/dL — ABNORMAL HIGH (ref 6–20)
CO2: 25 mmol/L (ref 22–32)
Calcium: 9.7 mg/dL (ref 8.9–10.3)
Chloride: 110 mmol/L (ref 98–111)
Creatinine, Ser: 0.95 mg/dL (ref 0.61–1.24)
GFR, Estimated: >60 mL/min (ref >60)
Glucose, Bld: 92 mg/dL (ref 70–99)
Potassium: 4.4 mmol/L (ref 3.5–5.1)
Sodium: 143 mmol/L (ref 135–145)
Total Bilirubin: 0.6 mg/dL (ref 0.3–1.2)
Total Protein: 6.4 g/dL — ABNORMAL LOW (ref 6.5–8.1)

## 2021-07-14 LAB — CBC WITH DIFFERENTIAL/PLATELET
Abs Immature Granulocytes: 0 10*3/uL (ref 0.00–0.07)
Basophils Absolute: 0 10*3/uL (ref 0.0–0.1)
Basophils Relative: 0 %
Eosinophils Absolute: 0.4 10*3/uL (ref 0.0–0.5)
Eosinophils Relative: 2 %
HCT: 38.1 % — ABNORMAL LOW (ref 39.0–52.0)
Hemoglobin: 12.2 g/dL — ABNORMAL LOW (ref 13.0–17.0)
Lymphocytes Relative: 67 %
Lymphs Abs: 13.2 10*3/uL — ABNORMAL HIGH (ref 0.7–4.0)
MCH: 29 pg (ref 26.0–34.0)
MCHC: 32 g/dL (ref 30.0–36.0)
MCV: 90.7 fL (ref 80.0–100.0)
Monocytes Absolute: 0.6 10*3/uL (ref 0.1–1.0)
Monocytes Relative: 3 %
Neutro Abs: 5.5 10*3/uL (ref 1.7–7.7)
Neutrophils Relative %: 28 %
Platelets: 145 10*3/uL — ABNORMAL LOW (ref 150–400)
RBC: 4.2 MIL/uL — ABNORMAL LOW (ref 4.22–5.81)
RDW: 14.4 % (ref 11.5–15.5)
WBC: 19.7 10*3/uL — ABNORMAL HIGH (ref 4.0–10.5)
nRBC: 0 % (ref 0.0–0.2)

## 2021-07-14 LAB — LACTATE DEHYDROGENASE: LDH: 149 U/L (ref 98–192)

## 2021-07-14 NOTE — Assessment & Plan Note (Signed)
This is a very pleasant 60 year old male patient with past medical history significant for testicular cancer referred to hematology for evaluation and treatment of CLL.  He is doing quite well.  No concerning review of systems or physical examination findings.  We have previously discussed the indications for treatment in patients with CLL.  FISH for CLL showed 13 q. deletion consistent with favorable prognosis, IgH V somatic hyper mutation not detected.  Treatment is indicated for patients with following disease related complications termed active disease.  1. Evidence of progressive marrow failure as manifested by development of worsening anemia or thrombocytopenia. Hemoglobin <10 g/dL or platelet count <100,000/microL are generally regarded as indications for treatment. However, platelet counts <100,000/microL may remain stable over a long period of time in some patients, and this situation does not automatically require treatment. 2. Massive or progressive splenomegaly 3. Massive or progressive or symptomatic lymphadenopathy 4. Progressive lymphocytosis with an increase of > 50 % over two month period of LDT of less than 6 months ( softer indication) 5. Constitutional symptoms. 6. Symptomatic or functional extranodal involvement. At this time there is no indication for treatment.  I have recommended repeating labs today and return to clinic in 3 to 4 months.  He was encouraged to contact us with any new questions or concerns.

## 2021-07-14 NOTE — Progress Notes (Signed)
Harold Benton NOTE  Patient Care Team: Eulas Post, MD as PCP - General  CHIEF COMPLAINTS/PURPOSE OF CONSULTATION:  Lymphocytosis, follow up  ASSESSMENT & PLAN:  CLL (chronic lymphocytic leukemia) (Harold Benton) This is a very pleasant 60 year old male patient with past medical history significant for testicular cancer referred to hematology for evaluation and treatment of CLL.  He is doing quite well.  No concerning review of systems or physical examination findings.  We have previously discussed the indications for treatment in patients with CLL.  FISH for CLL showed 13 q. deletion consistent with favorable prognosis, IgH V somatic hyper mutation not detected.  Treatment is indicated for patients with following disease related complications termed active disease.  Evidence of progressive marrow failure as manifested by development of worsening anemia or thrombocytopenia. Hemoglobin <10 g/dL or platelet count <100,000/microL are generally regarded as indications for treatment. However, platelet counts <100,000/microL may remain stable over a long period of time in some patients, and this situation does not automatically require treatment. Massive or progressive splenomegaly Massive or progressive or symptomatic lymphadenopathy Progressive lymphocytosis with an increase of > 50 % over two month period of LDT of less than 6 months ( softer indication) Constitutional symptoms. Symptomatic or functional extranodal involvement. At this time there is no indication for treatment.  I have recommended repeating labs today and return to clinic in 3 to 4 months.  He was encouraged to contact us with any new questions or concerns.  Orders Placed This Encounter  Procedures   CBC with Differential/Platelet    Standing Status:   Standing    Number of Occurrences:   22    Standing Expiration Date:   07/14/2022   Comprehensive metabolic panel    Standing Status:   Standing    Number  of Occurrences:   33    Standing Expiration Date:   07/14/2022   Lactate dehydrogenase    Standing Status:   Future    Standing Expiration Date:   07/14/2022     HISTORY OF PRESENTING ILLNESS:   Harold Benton 60 y.o. male is here because of lymphocytosis.  This is a very pleasant 60 year old male patient with past medical history significant for testicular cancer, recent pneumonia in February 2022 with multiple courses of antibiotics referred to hematology for evaluation of lymphocytosis.   Flow confirmed monoclonal B-cell population. FISH suggested mono allelic deletion of 13 q. 14 no evidence of 17 P/ATM or trisomy 12 IGVH somatic hyper mutation was not detected.  Interim History  [name] is here for a follow up. He is doing well. He has been running about 3/4 miles a day. He is very active, continues to play tennis No B symptoms He had a cold few weeks ago, recovering well, needed some abx. NO change in breathing, bowel habits or urinary habits No new neurological complaints. Rest of the pertinent 10 point ROS reviewed and negative.  REVIEW OF SYSTEMS:   Constitutional: Denies fevers, chills or abnormal night sweats Eyes: Denies blurriness of vision, double vision or watery eyes Ears, nose, mouth, throat, and face: Denies mucositis or sore throat Respiratory: As mentioned above Cardiovascular: Denies palpitation, chest discomfort or lower extremity swelling Gastrointestinal:  Denies nausea, heartburn or change in bowel habits Skin: Denies abnormal skin rashes Lymphatics: Denies new lymphadenopathy or easy bruising Neurological:Denies numbness, tingling or new weaknesses Behavioral/Psych: Mood is stable, no new changes  All other systems were reviewed with the patient and are negative.  MEDICAL  HISTORY:  Past Medical History:  Diagnosis Date   Testicular cancer ([place]) 1984    SURGICAL HISTORY: Past Surgical History:  Procedure Laterality Date   EXPLORATORY  LAPAROTOMY  1984   tecticle removal  1984    SOCIAL HISTORY: Social History   Socioeconomic History   Marital status: Married    Spouse name: Not on file   Number of children: Not on file   Years of education: Not on file   Highest education level: Not on file  Occupational History   Not on file  Tobacco Use   Smoking status: Never   Smokeless tobacco: Never  Substance and Sexual Activity   Alcohol use: Yes    Alcohol/week: 6.0 standard drinks    Types: 6 Cans of beer per week   Drug use: No   Sexual activity: Not on file  Other Topics Concern   Not on file  Social History Narrative   Not on file   Social Determinants of Health   Financial Resource Strain: Not on file  Food Insecurity: Not on file  Transportation Needs: Not on file  Physical Activity: Not on file  Stress: Not on file  Social Connections: Not on file  Intimate Partner Violence: Not on file    FAMILY HISTORY: Family History  Problem Relation Age of Onset   Cancer Mother        ?type   Cancer Father        CLL   Colon cancer Neg Hx    Stomach cancer Neg Hx     ALLERGIES:  has No Known Allergies.  MEDICATIONS:  Current Outpatient Medications  Medication Sig Dispense Refill   albuterol (VENTOLIN HFA) 108 (90 Base) MCG/ACT inhaler Inhale 2 puffs into the lungs every 6 (six) hours as needed for wheezing or shortness of breath. 8 g 0   Ascorbic Acid (VITAMIN C) 1000 MG tablet Take 1,000 mg by mouth daily.     calcium carbonate (OS-CAL) 600 MG TABS Take 600 mg by mouth daily.     loratadine (CLARITIN) 10 MG tablet Take 10 mg by mouth daily.     Multiple Vitamin (MULTIVITAMIN) tablet Take 1 tablet by mouth daily.     sildenafil (REVATIO) 20 MG tablet TAKE 2-5 TABLETS BY MOUTH 1 HOUR PIRO TO SEXUAL ACTIVITY 50 tablet 5   amoxicillin-clavulanate (AUGMENTIN) 875-125 MG tablet Take 1 tablet by mouth 2 (two) times daily. 14 tablet 0   Nirmatrelvir & Ritonavir (PAXLOVID) 20 x 150 MG & 10 x 100MG TBPK  Take 3 tablets by mouth 2 (two) times daily. 30 tablet 0   predniSONE (DELTASONE) 10 MG tablet 41m daily for 7 days, 396mdaily for 7 days, 2072maily for 7 days, 29m26mily for 7 days, 5mg 18mly for 7 days 60 tablet 0   Tiotropium Bromide-Olodaterol (STIOLTO RESPIMAT) 2.5-2.5 MCG/ACT AERS Inhale 2 puffs into the lungs daily. 4 g 0   No current facility-administered medications for this visit.    PHYSICAL EXAMINATION: ECOG PERFORMANCE STATUS: 0 - Asymptomatic  Vitals:   07/14/21 0929  BP: 139/79  Pulse: (!) 49  Resp: 17  Temp: 97.8 F (36.6 C)  SpO2: 99%    Filed Weights   07/14/21 0929  Weight: 146 lb 9.6 oz (66.5 kg)   Physical Exam Constitutional:      Appearance: Normal appearance.  HENT:     Head: Normocephalic and atraumatic.  Cardiovascular:     Rate and Rhythm: Normal rate and regular rhythm.  Pulses: Normal pulses.     Heart sounds: Normal heart sounds.  Pulmonary:     Effort: Pulmonary effort is normal.     Breath sounds: Normal breath sounds.  Abdominal:     General: Abdomen is flat. There is no distension.     Palpations: Abdomen is soft. There is no mass.     Tenderness: There is no abdominal tenderness.  Musculoskeletal:        General: No swelling or tenderness.     Cervical back: Normal range of motion and neck supple. No rigidity.  Lymphadenopathy:     Cervical: No cervical adenopathy.  Skin:    General: Skin is warm and dry.  Neurological:     General: No focal deficit present.     Mental Status: He is alert.  Psychiatric:        Mood and Affect: Mood normal.    LABORATORY DATA:  I have reviewed the data as listed Lab Results  Component Value Date   WBC 15.0 (H) 05/19/2021   HGB 13.3 05/19/2021   HCT 40.2 05/19/2021   MCV 89.9 05/19/2021   PLT 127 (L) 05/19/2021     Chemistry      Component Value Date/Time   NA 143 05/19/2021 0915   K 3.9 05/19/2021 0915   CL 107 05/19/2021 0915   CO2 26 05/19/2021 0915   BUN 20 05/19/2021  0915   CREATININE 0.97 05/19/2021 0915   CREATININE 1.10 07/22/2020 1358      Component Value Date/Time   CALCIUM 9.4 05/19/2021 0915   ALKPHOS 80 05/19/2021 0915   AST 26 05/19/2021 0915   ALT 26 05/19/2021 0915   BILITOT 0.8 05/19/2021 0915     Flow consistent with CLL, 13 q deletion, favorable prognosis.  RADIOGRAPHIC STUDIES: I have personally reviewed the radiological images as listed and agreed with the findings in the report. No results found.  All questions were answered. The patient knows to call the clinic with any problems, questions or concerns. I spent 20 minutes in the care of this patient including H and P, review of records, counseling and coordination of care.     Benay Pike, MD 07/14/2021 10:06 AM

## 2021-07-25 ENCOUNTER — Encounter: Payer: 59 | Admitting: Family Medicine

## 2021-08-05 ENCOUNTER — Encounter: Payer: 59 | Admitting: Family Medicine

## 2021-08-07 IMAGING — DX DG FOOT COMPLETE 3+V*R*
3 series · 3 of 3 positions shown · non-contrast
Comparison: 07/26/2020

CLINICAL DATA: The evaluate RIGHT LATERAL foot pain. Symptoms for 2
months. No known injury.

EXAM:
RIGHT FOOT COMPLETE - 3+ VIEW

[foot ap]
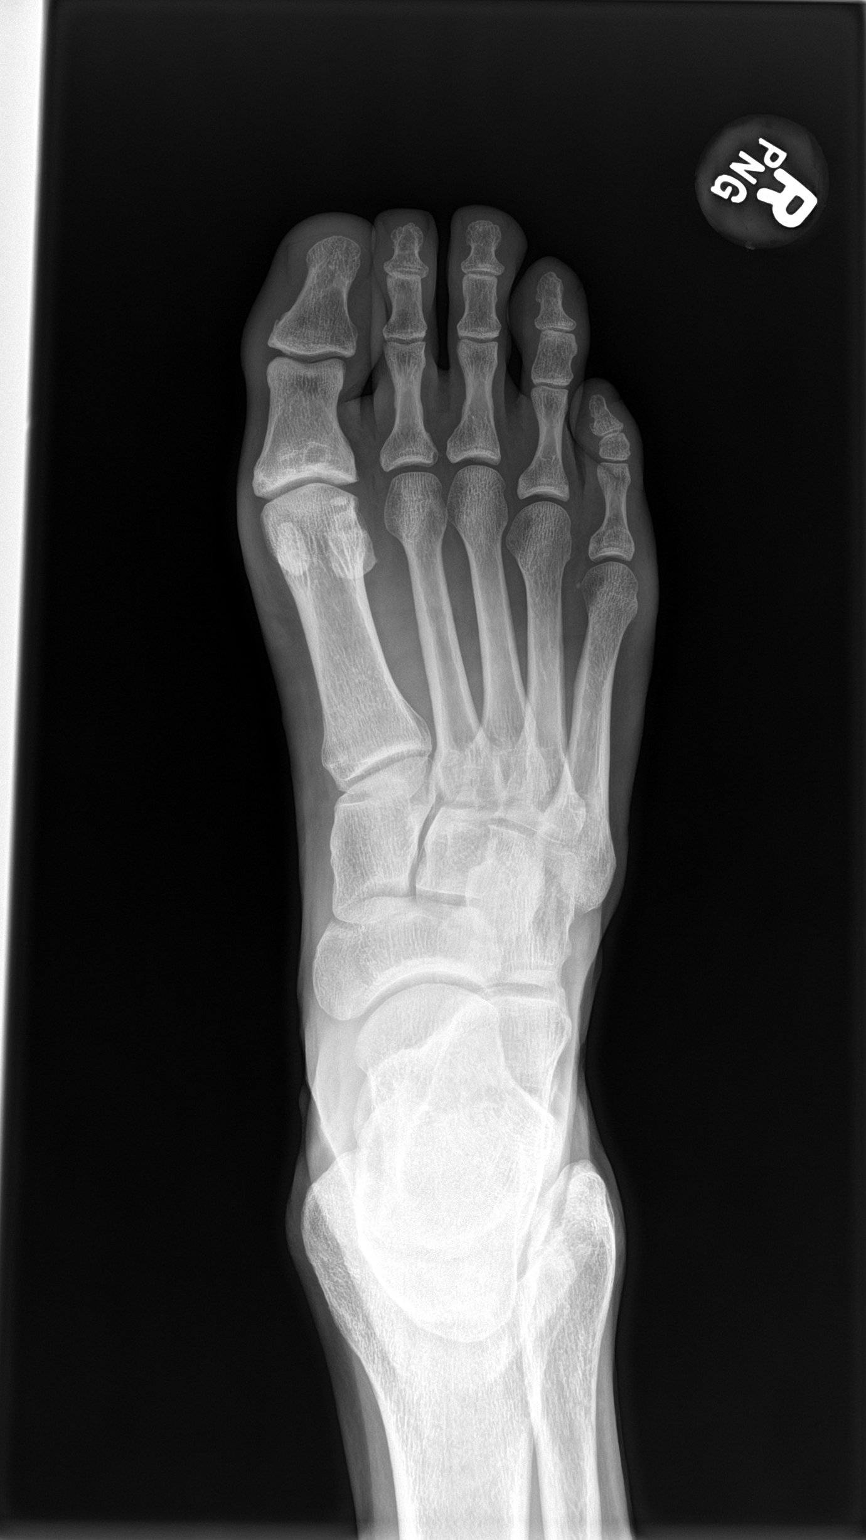

[foot obl]
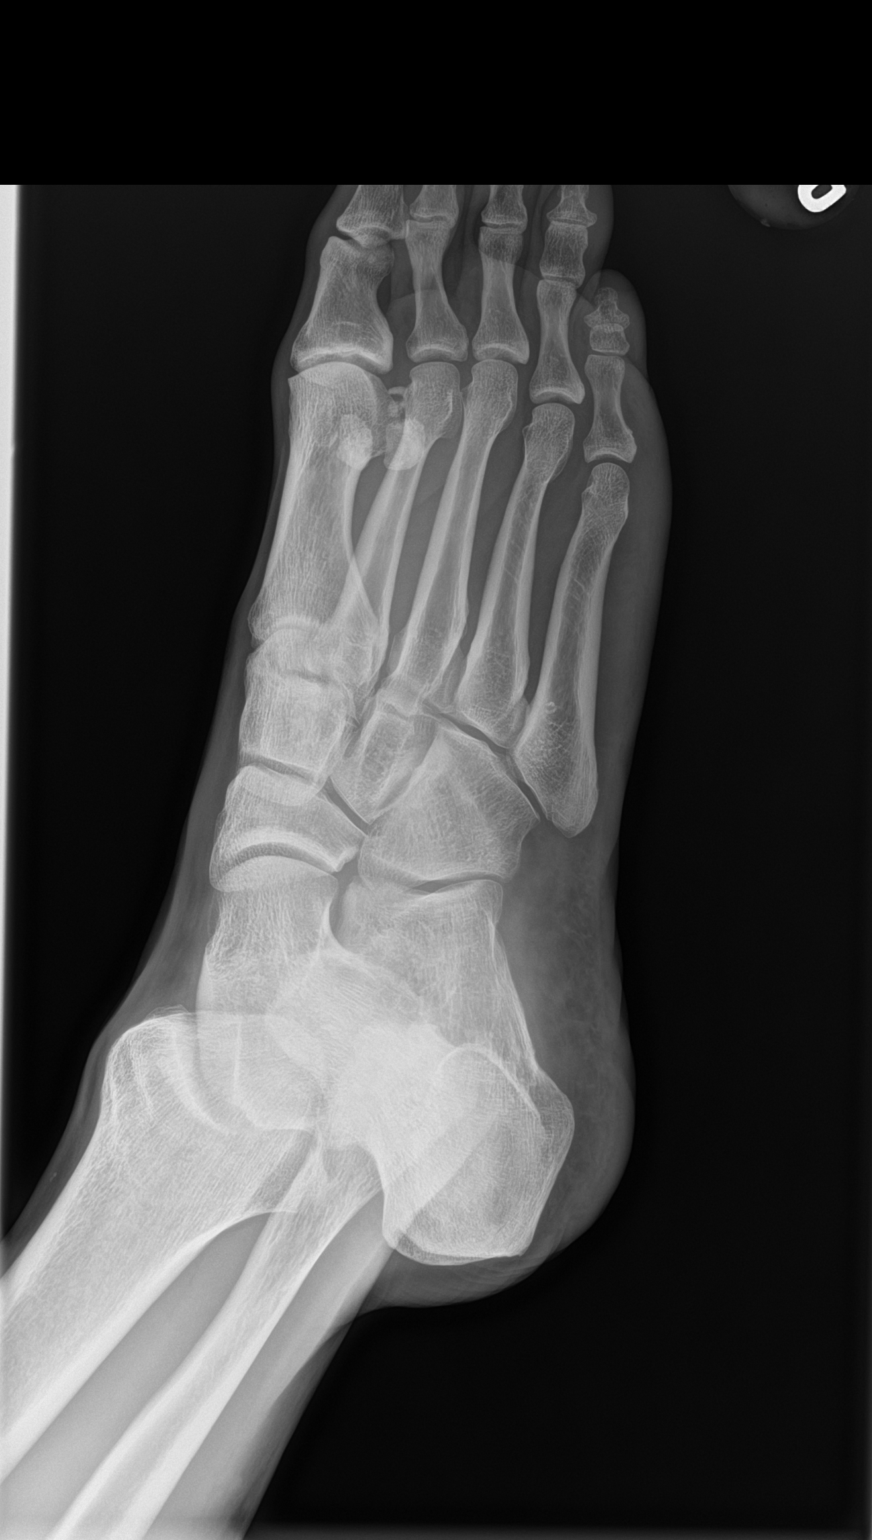

[foot lat]
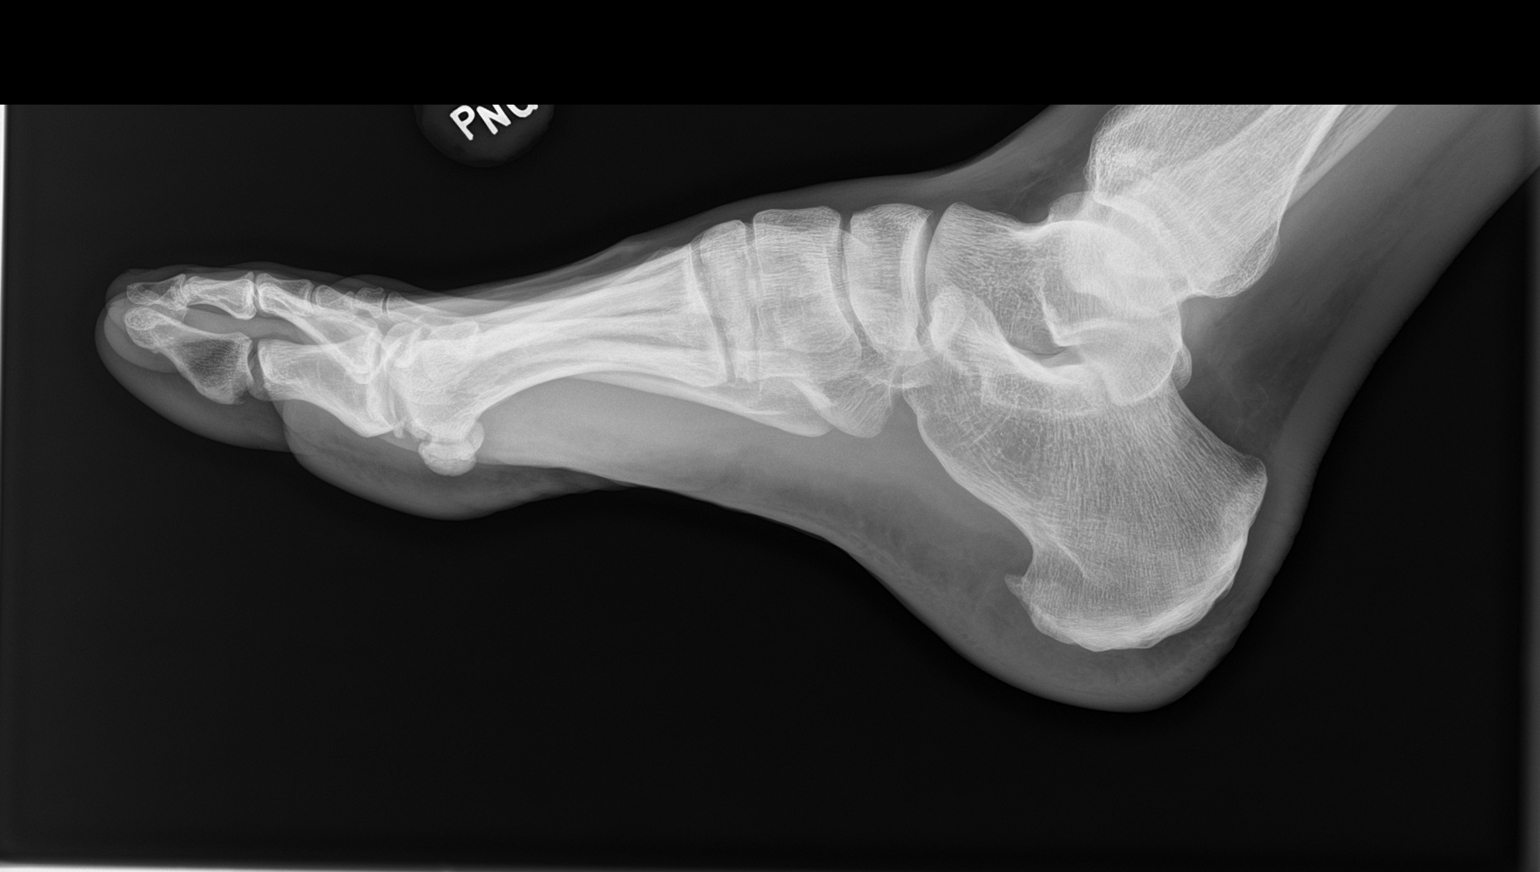

[3 of 3 positions shown; findings below may reference images not displayed]

FINDINGS: There is no evidence of fracture or dislocation. There is no
evidence of arthropathy or other focal bone abnormality. Soft
tissues are unremarkable.
IMPRESSION: Negative.

## 2021-08-22 ENCOUNTER — Encounter: Payer: Self-pay | Admitting: Hematology and Oncology

## 2021-08-27 ENCOUNTER — Other Ambulatory Visit (HOSPITAL_BASED_OUTPATIENT_CLINIC_OR_DEPARTMENT_OTHER): Payer: Self-pay

## 2021-08-27 MED ORDER — INFLUENZA VAC SPLIT QUAD 0.5 ML IM SUSY
PREFILLED_SYRINGE | INTRAMUSCULAR | 0 refills | Status: DC
  Filled 2021-08-27: qty 0.5, 1d supply, fill #0

## 2021-09-01 ENCOUNTER — Other Ambulatory Visit (HOSPITAL_COMMUNITY): Payer: Self-pay

## 2021-09-04 ENCOUNTER — Other Ambulatory Visit: Payer: Self-pay

## 2021-09-05 ENCOUNTER — Ambulatory Visit (INDEPENDENT_AMBULATORY_CARE_PROVIDER_SITE_OTHER): Payer: 59 | Admitting: Family Medicine

## 2021-09-05 ENCOUNTER — Encounter: Payer: Self-pay | Admitting: Family Medicine

## 2021-09-05 VITALS — BP 120/70 | HR 45 | Temp 97.8°F | Ht 71.0 in | Wt 146.8 lb

## 2021-09-05 DIAGNOSIS — Z0000 Encounter for general adult medical examination without abnormal findings: Secondary | ICD-10-CM

## 2021-09-05 DIAGNOSIS — Z1211 Encounter for screening for malignant neoplasm of colon: Secondary | ICD-10-CM

## 2021-09-05 LAB — LIPID PANEL
Cholesterol: 176 mg/dL (ref 0–200)
HDL: 51.6 mg/dL (ref >39.00)
LDL Cholesterol: 108 mg/dL — ABNORMAL HIGH (ref 0–99)
NonHDL: 124.88
Total CHOL/HDL Ratio: 3
Triglycerides: 86 mg/dL (ref 0.0–149.0)
VLDL: 17.2 mg/dL (ref 0.0–40.0)

## 2021-09-05 LAB — TSH: TSH: 2.11 u[IU]/mL (ref 0.35–5.50)

## 2021-09-05 LAB — PSA: PSA: 0.18 ng/mL (ref 0.10–4.00)

## 2021-09-05 NOTE — Addendum Note (Signed)
Addended by: Amanda Cockayne on: 09/05/2021 07:42 AM   Modules accepted: Orders

## 2021-09-05 NOTE — Patient Instructions (Signed)
Consider Shingrix vaccine at some point this year.  

## 2021-09-05 NOTE — Progress Notes (Signed)
Established Patient Office Visit  Subjective:  Patient ID: Harold Benton, male    DOB: 25-Mar-1961  Age: 60 y.o. MRN: [id number]  CC:  Chief Complaint  Patient presents with   Annual Exam    HPI Harold Benton presents for physical exam.  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  His recent counts of been stable.  He has history of asthma.  Followed by pulmonary.  Remote history of testicular cancer.  Generally doing well.  Exercises several days per week.  Usually tries to 4 miles per day combination of walking and running.  Health maintenance reviewed  -Due for repeat colonoscopy. -Flu vaccine already given -Has had 1 prior Pneumovax -Previous hepatitis C screening negative -No history of Shingrix  Family history reviewed.  His father had CLL.  Died of "old age ".  Mother died age 54 of some type of abdominal cancer.  Is not sure which type.  He had a sister that died of complications of alcohol abuse.  Social history married with 2 children.  He has 3 grandchildren.  Non-smoker.  Drinks about 10 beers per week.  Past Medical History:  Diagnosis Date   Testicular cancer ([facility]) 1984    Past Surgical History:  Procedure Laterality Date   EXPLORATORY LAPAROTOMY  1984   tecticle removal  1984    Family History  Problem Relation Age of Onset   Cancer Mother        ?type   Cancer Father        CLL   Alcohol abuse Sister    Colon cancer Neg Hx    Stomach cancer Neg Hx     Social History   Socioeconomic History   Marital status: Married    Spouse name: Not on file   Number of children: Not on file   Years of education: Not on file   Highest education level: Not on file  Occupational History   Not on file  Tobacco Use   Smoking status: Never   Smokeless tobacco: Never  Substance and Sexual Activity   Alcohol use: Yes    Alcohol/week: 6.0 standard drinks    Types: 6 Cans of beer per week   Drug use: No   Sexual activity: Not on file  Other Topics Concern    Not on file  Social History Narrative   Not on file   Social Determinants of Health   Financial Resource Strain: Not on file  Food Insecurity: Not on file  Transportation Needs: Not on file  Physical Activity: Not on file  Stress: Not on file  Social Connections: Not on file  Intimate Partner Violence: Not on file    Outpatient Medications Prior to Visit  Medication Sig Dispense Refill   albuterol (VENTOLIN HFA) 108 (90 Base) MCG/ACT inhaler Inhale 2 puffs into the lungs every 6 (six) hours as needed for wheezing or shortness of breath. 8 g 0   Ascorbic Acid (VITAMIN C) 1000 MG tablet Take 1,000 mg by mouth daily.     calcium carbonate (OS-CAL) 600 MG TABS Take 600 mg by mouth daily.     influenza vac split quadrivalent PF (FLUARIX) 0.5 ML injection Inject into the muscle. 0.5 mL 0   loratadine (CLARITIN) 10 MG tablet Take 10 mg by mouth daily.     Multiple Vitamin (MULTIVITAMIN) tablet Take 1 tablet by mouth daily.     sildenafil (REVATIO) 20 MG tablet TAKE 2-5 TABLETS BY MOUTH 1 HOUR PIRO TO  SEXUAL ACTIVITY 50 tablet 5   amoxicillin-clavulanate (AUGMENTIN) 875-125 MG tablet Take 1 tablet by mouth 2 (two) times daily. 14 tablet 0   Nirmatrelvir & Ritonavir (PAXLOVID) 20 x 150 MG & 10 x 100MG  TBPK Take 3 tablets by mouth 2 (two) times daily. 30 tablet 0   predniSONE (DELTASONE) 10 MG tablet 40mg  daily for 7 days, 30mg  daily for 7 days, 20mg  daily for 7 days, 10mg  daily for 7 days, 5mg  daily for 7 days 60 tablet 0   Tiotropium Bromide-Olodaterol (STIOLTO RESPIMAT) 2.5-2.5 MCG/ACT AERS Inhale 2 puffs into the lungs daily. 4 g 0   No facility-administered medications prior to visit.    No Known Allergies  ROS Review of Systems  Constitutional:  Negative for activity change, appetite change, fatigue and fever.  HENT:  Negative for congestion, ear pain and trouble swallowing.   Eyes:  Negative for pain and visual disturbance.  Respiratory:  Negative for cough, shortness of breath  and wheezing.   Cardiovascular:  Negative for chest pain and palpitations.  Gastrointestinal:  Negative for abdominal distention, abdominal pain, blood in stool, constipation, diarrhea, nausea, rectal pain and vomiting.  Endocrine: Negative for polydipsia and polyuria.  Genitourinary:  Negative for dysuria, hematuria and testicular pain.  Musculoskeletal:  Negative for arthralgias and joint swelling.  Skin:  Negative for rash.  Neurological:  Negative for dizziness, syncope and headaches.  Hematological:  Negative for adenopathy.  Psychiatric/Behavioral:  Negative for confusion and dysphoric mood.      Objective:    Physical Exam Constitutional:      General: He is not in acute distress.    Appearance: He is well-developed.  HENT:     Head: Normocephalic and atraumatic.     Right Ear: External ear normal.     Left Ear: External ear normal.  Eyes:     Pupils: Pupils are equal, round, and reactive to light.  Neck:     Thyroid: No thyromegaly.  Cardiovascular:     Rate and Rhythm: Normal rate and regular rhythm.     Heart sounds: Normal heart sounds. No murmur heard. Pulmonary:     Effort: No respiratory distress.     Breath sounds: No wheezing or rales.  Abdominal:     General: Bowel sounds are normal. There is no distension.     Palpations: Abdomen is soft. There is no mass.     Tenderness: There is no abdominal tenderness. There is no guarding or rebound.  Musculoskeletal:     Cervical back: Normal range of motion and neck supple.     Right lower leg: No edema.     Left lower leg: No edema.  Lymphadenopathy:     Cervical: No cervical adenopathy.  Skin:    Findings: No rash.  Neurological:     Mental Status: He is alert and oriented to person, place, and time.     Cranial Nerves: No cranial nerve deficit.    BP 120/70 (BP Location: Left Arm, Patient Position: Sitting, Cuff Size: Normal)   Pulse (!) 45   Temp 97.8 F (36.6 C) (Oral)   Ht 5\' 11"  (1.803 m)   Wt 146  lb 12.8 oz (66.6 kg)   SpO2 99%   BMI 20.47 kg/m  Wt Readings from Last 3 Encounters:  09/05/21 146 lb 12.8 oz (66.6 kg)  07/14/21 146 lb 9.6 oz (66.5 kg)  04/07/21 145 lb 1.6 oz (65.8 kg)     Health Maintenance Due  Topic Date Due  HIV Screening  Never done   Zoster Vaccines- Shingrix (1 of 2) Never done   Pneumococcal Vaccine 72-59 Years old (2 - PCV) 06/17/2010   COVID-19 Vaccine (4 - Booster for Pfizer series) 12/17/2020    There are no preventive care reminders to display for this patient.  Lab Results  Component Value Date   TSH 1.39 07/22/2020   Lab Results  Component Value Date   WBC 19.7 (H) 07/14/2021   HGB 12.2 (L) 07/14/2021   HCT 38.1 (L) 07/14/2021   MCV 90.7 07/14/2021   PLT 145 (L) 07/14/2021   Lab Results  Component Value Date   NA 143 07/14/2021   K 4.4 07/14/2021   CO2 25 07/14/2021   GLUCOSE 92 07/14/2021   BUN 29 (H) 07/14/2021   CREATININE 0.95 07/14/2021   BILITOT 0.6 07/14/2021   ALKPHOS 80 07/14/2021   AST 22 07/14/2021   ALT 20 07/14/2021   PROT 6.4 (L) 07/14/2021   ALBUMIN 4.0 07/14/2021   CALCIUM 9.7 07/14/2021   ANIONGAP 8 07/14/2021   GFR 92.98 03/25/2021   Lab Results  Component Value Date   CHOL 185 07/22/2020   Lab Results  Component Value Date   HDL 60 07/22/2020   Lab Results  Component Value Date   LDLCALC 107 (H) 07/22/2020   Lab Results  Component Value Date   TRIG 89 07/22/2020   Lab Results  Component Value Date   CHOLHDL 3.1 07/22/2020   No results found for: HGBA1C    Assessment & Plan:   Problem List Items Addressed This Visit   None Visit Diagnoses     Colon cancer screening    -  Primary   Relevant Orders   Ambulatory referral to Gastroenterology   Physical exam       Relevant Orders   Lipid panel   TSH   PSA     He is getting regular CBCs and comprehensive metabolic panel is through oncology.  We will obtain TSH, PSA, lipid panel today.  Flu vaccine already given  Recommend he  consider Shingrix.  He will check on insurance coverage  Set up for repeat colonoscopy  No orders of the defined types were placed in this encounter.   Follow-up: No follow-ups on file.    Carolann Littler, MD

## 2021-09-26 ENCOUNTER — Encounter: Payer: Self-pay | Admitting: Pulmonary Disease

## 2021-09-26 NOTE — Telephone Encounter (Signed)
JD please advise. Thanks  

## 2021-09-28 MED ORDER — AMOXICILLIN-POT CLAVULANATE 875-125 MG PO TABS: 1.0000 | ORAL_TABLET | Freq: Two times a day (BID) | ORAL | 0 refills | Status: DC

## 2021-10-16 ENCOUNTER — Encounter: Payer: Self-pay | Admitting: Internal Medicine

## 2021-10-20 ENCOUNTER — Encounter: Payer: Self-pay | Admitting: Pulmonary Disease

## 2021-10-20 ENCOUNTER — Ambulatory Visit (INDEPENDENT_AMBULATORY_CARE_PROVIDER_SITE_OTHER): Payer: 59 | Admitting: Pulmonary Disease

## 2021-10-20 ENCOUNTER — Other Ambulatory Visit: Payer: Self-pay

## 2021-10-20 ENCOUNTER — Ambulatory Visit (INDEPENDENT_AMBULATORY_CARE_PROVIDER_SITE_OTHER): Payer: 59

## 2021-10-20 VITALS — BP 120/72 | HR 59 | Ht 71.0 in | Wt 144.6 lb

## 2021-10-20 DIAGNOSIS — J4520 Mild intermittent asthma, uncomplicated: Secondary | ICD-10-CM | POA: Diagnosis not present

## 2021-10-20 DIAGNOSIS — R0602 Shortness of breath: Secondary | ICD-10-CM | POA: Diagnosis not present

## 2021-10-20 DIAGNOSIS — J329 Chronic sinusitis, unspecified: Secondary | ICD-10-CM

## 2021-10-20 NOTE — Patient Instructions (Signed)
We will check pulmonary function tests before end of year if possible  Check chest x-ray today  Happy Holidays!

## 2021-10-20 NOTE — Progress Notes (Signed)
Synopsis: Referred in May 2022 for cough by Carolann Littler, MD  Subjective:   PATIENT ID: Harold Benton GENDER: male DOB: [birth year], MRN: [id number]  HPI  Chief Complaint  Patient presents with   Follow-up    6 mo f/u. Finished the abx and the congestion is now gone.    Harold Benton is a 60 year old male, never smoker with history of testicular cancer who returns to pulmonary clinic for cough.   He was referred to hematology for leukocytosis at last visit and diagnosed with CLL. He does not require active treatment at this time and they are monitoring his blood counts regularly.   He was treated with augmentin 11/25 after experiencing sinus and chest congestion. He had covid 19 infection in 04/2021.   There was concern for possible organizing pneumonia based on his CT Chest findings from 01/2021 and steroid taper was completed over a 4 week period.  OV 03/25/21 He reports having a cough since February 2022 where he was diagnosed with left lower lobe pneumonia and treated with augmentin and azithromycin. He initially had improvement clinically and radiographicly on repeat chest radiogrph on 3/11 and 3/31. The cough persisted but worsened again in April where he was treated with a course of levaquin. A CT Chest was done on 02/14/21 which showed airspace disease of the inferior right middle lobe, lingula, right lower lobe and most prominantly in the left lower lobe. There is bronchial wall thickening in the lower lobes with mucous plugging in the bilateral lower lobe airways. Also noted is mildly prominent bilateral axially lymph nodes.  He continues to cough despite the course of levaquin. He is coughing up clear to yellowish sputum. He does have intermittent wheezing.  He also reports right sided neck discomfort and stiffness with limited range of motion of his neck. He also complains of muscle cramping in his calfs and right knee soreness.   His paternal gradparents had emphysema and his  father has history of CLL.   Past Medical History:  Diagnosis Date   Testicular cancer ([place]) 1984     Family History  Problem Relation Age of Onset   Cancer Mother        ?type   Cancer Father        CLL   Alcohol abuse Sister    Colon cancer Neg Hx    Stomach cancer Neg Hx      Social History   Socioeconomic History   Marital status: Married    Spouse name: Not on file   Number of children: Not on file   Years of education: Not on file   Highest education level: Not on file  Occupational History   Not on file  Tobacco Use   Smoking status: Never   Smokeless tobacco: Never  Substance and Sexual Activity   Alcohol use: Yes    Alcohol/week: 6.0 standard drinks    Types: 6 Cans of beer per week   Drug use: No   Sexual activity: Not on file  Other Topics Concern   Not on file  Social History Narrative   Not on file   Social Determinants of Health   Financial Resource Strain: Not on file  Food Insecurity: Not on file  Transportation Needs: Not on file  Physical Activity: Not on file  Stress: Not on file  Social Connections: Not on file  Intimate Partner Violence: Not on file     No Known Allergies   Outpatient Medications Prior  to Visit  Medication Sig Dispense Refill   Ascorbic Acid (VITAMIN C) 1000 MG tablet Take 1,000 mg by mouth daily.     calcium carbonate (OS-CAL) 600 MG TABS Take 600 mg by mouth daily.     loratadine (CLARITIN) 10 MG tablet Take 10 mg by mouth daily.     Multiple Vitamin (MULTIVITAMIN) tablet Take 1 tablet by mouth daily.     sildenafil (REVATIO) 20 MG tablet TAKE 2-5 TABLETS BY MOUTH 1 HOUR PIRO TO SEXUAL ACTIVITY 50 tablet 5   albuterol (VENTOLIN HFA) 108 (90 Base) MCG/ACT inhaler Inhale 2 puffs into the lungs every 6 (six) hours as needed for wheezing or shortness of breath. 8 g 0   amoxicillin-clavulanate (AUGMENTIN) 875-125 MG tablet Take 1 tablet by mouth 2 (two) times daily. 14 tablet 0   influenza vac split quadrivalent PF  (FLUARIX) 0.5 ML injection Inject into the muscle. 0.5 mL 0   No facility-administered medications prior to visit.    Review of Systems  Constitutional:  Negative for chills, fever, malaise/fatigue and weight loss.  HENT:  Negative for congestion, sinus pain and sore throat.   Eyes: Negative.   Respiratory:  Positive for shortness of breath. Negative for cough, hemoptysis, sputum production and wheezing.   Cardiovascular:  Negative for chest pain, palpitations, orthopnea, claudication and leg swelling.  Gastrointestinal:  Negative for abdominal pain, heartburn, nausea and vomiting.  Genitourinary: Negative.   Musculoskeletal:  Negative for myalgias.  Skin:  Negative for rash.  Neurological:  Negative for weakness.  Endo/Heme/Allergies: Negative.   Psychiatric/Behavioral: Negative.     Objective:   Vitals:   10/20/21 0911  BP: 120/72  Pulse: (!) 59  SpO2: 100%  Weight: 144 lb 9.6 oz (65.6 kg)  Height: 5\' 11"  (1.803 m)     Physical Exam Constitutional:      General: He is not in acute distress.    Appearance: Normal appearance.  HENT:     Head: Normocephalic and atraumatic.  Eyes:     Conjunctiva/sclera: Conjunctivae normal.  Cardiovascular:     Rate and Rhythm: Normal rate and regular rhythm.     Pulses: Normal pulses.     Heart sounds: Normal heart sounds. No murmur heard. Pulmonary:     Effort: Pulmonary effort is normal.     Breath sounds: Normal breath sounds. No wheezing or rhonchi.  Musculoskeletal:     Cervical back: Neck supple.     Right lower leg: No edema.     Left lower leg: No edema.  Skin:    General: Skin is warm and dry.  Neurological:     General: No focal deficit present.     Mental Status: He is alert.  Psychiatric:        Mood and Affect: Mood normal.        Behavior: Behavior normal.        Thought Content: Thought content normal.        Judgment: Judgment normal.      CBC    Component Value Date/Time   WBC 19.7 (H) 07/14/2021  1007   RBC 4.20 (L) 07/14/2021 1007   HGB 12.2 (L) 07/14/2021 1007   HGB 13.3 05/19/2021 0915   HCT 38.1 (L) 07/14/2021 1007   PLT 145 (L) 07/14/2021 1007   PLT 127 (L) 05/19/2021 0915   MCV 90.7 07/14/2021 1007   MCH 29.0 07/14/2021 1007   MCHC 32.0 07/14/2021 1007   RDW 14.4 07/14/2021 1007   LYMPHSABS 13.2 (  H) 07/14/2021 1007   MONOABS 0.6 07/14/2021 1007   EOSABS 0.4 07/14/2021 1007   BASOSABS 0.0 07/14/2021 1007   BMP Latest Ref Rng & Units 07/14/2021 05/19/2021 03/27/2021  Glucose 70 - 99 mg/dL 92 87 88  BUN 6 - 20 mg/dL 29(H) 20 25(H)  Creatinine 0.61 - 1.24 mg/dL 0.95 0.97 0.87  BUN/Creat Ratio 6 - 22 (calc) - - -  Sodium 135 - 145 mmol/L 143 143 144  Potassium 3.5 - 5.1 mmol/L 4.4 3.9 4.0  Chloride 98 - 111 mmol/L 110 107 107  CO2 22 - 32 mmol/L 25 26 27   Calcium 8.9 - 10.3 mg/dL 9.7 9.4 9.6   Chest imaging: CT Chest wo contrast 02/14/21 Airspace disease in the lower lungs bilaterally as described above, most notable in the left lower lobe where there are areas of consolidation. Findings are concerning for pneumonia.   Peribronchial thickening and mucous plugging in the lower lobe Airways.  CXR 12/23/20 Normal cardiac silhouette. Segmental airspace consolidation in the LEFT lower lobe. LEFT upper lobe is clear. RIGHT lung is clear. No acute osseous abnormality.  PFT: No flowsheet data found.  Labs: 4/12: ANA negative, RA 15    Assessment & Plan:   Mild intermittent reactive airway disease without complication  Recurrent sinusitis  Shortness of breath - Plan: Pulmonary Function Test, DG Chest 2 View  Discussion: Harold Benton is a 60 year old male, never smoker with history of testicular cancer and CLL who returns to pulmonary clinic for cough.   His cough has resolved with treatment of infectious pneumonia along with short course of steroids for possible organizing pneumonia earlier this year. His sinus and chest congestion has resolved of recent with  course of augmentin.   Chest x-ray is unremarkable today which shows resolution of previous infiltrates from earlier this year, likely representing an infectious pneumonia.  We will check  pulmonary function tests to further evaluate his shortness of breath and rule out other etiologies other than deconditioning.   Follow up in 3 months.  Freda [name], MD [facility] Office: [phone]    Current Outpatient Medications:    Ascorbic Acid (VITAMIN C) 1000 MG tablet, Take 1,000 mg by mouth daily., Disp: , Rfl:    calcium carbonate (OS-CAL) 600 MG TABS, Take 600 mg by mouth daily., Disp: , Rfl:    loratadine (CLARITIN) 10 MG tablet, Take 10 mg by mouth daily., Disp: , Rfl:    Multiple Vitamin (MULTIVITAMIN) tablet, Take 1 tablet by mouth daily., Disp: , Rfl:    sildenafil (REVATIO) 20 MG tablet, TAKE 2-5 TABLETS BY MOUTH 1 HOUR PIRO TO SEXUAL ACTIVITY, Disp: 50 tablet, Rfl: 5

## 2021-10-21 ENCOUNTER — Encounter: Payer: Self-pay | Admitting: Pulmonary Disease

## 2021-10-24 ENCOUNTER — Ambulatory Visit (INDEPENDENT_AMBULATORY_CARE_PROVIDER_SITE_OTHER): Payer: 59 | Admitting: Pulmonary Disease

## 2021-10-24 ENCOUNTER — Other Ambulatory Visit: Payer: Self-pay

## 2021-10-24 DIAGNOSIS — R0602 Shortness of breath: Secondary | ICD-10-CM

## 2021-10-24 LAB — PULMONARY FUNCTION TEST
DL/VA % pred: 84 %
DL/VA: 3.55 ml/min/mmHg/L
DLCO cor % pred: 87 %
DLCO cor: 25.03 ml/min/mmHg
DLCO unc % pred: 87 %
DLCO unc: 25.03 ml/min/mmHg
FEF 25-75 Post: 2.02 L/sec
FEF 25-75 Pre: 1.17 L/sec
FEF2575-%Change-Post: 71 %
FEF2575-%Pred-Post: 65 %
FEF2575-%Pred-Pre: 38 %
FEV1-%Change-Post: 24 %
FEV1-%Pred-Post: 76 %
FEV1-%Pred-Pre: 61 %
FEV1-Post: 2.87 L
FEV1-Pre: 2.31 L
FEV1FVC-%Change-Post: 16 %
FEV1FVC-%Pred-Pre: 74 %
FEV6-%Change-Post: 7 %
FEV6-%Pred-Post: 93 %
FEV6-%Pred-Pre: 86 %
FEV6-Post: 4.4 L
FEV6-Pre: 4.1 L
FEV6FVC-%Change-Post: 0 %
FEV6FVC-%Pred-Post: 104 %
FEV6FVC-%Pred-Pre: 104 %
FVC-%Change-Post: 6 %
FVC-%Pred-Post: 89 %
FVC-%Pred-Pre: 83 %
FVC-Post: 4.41 L
FVC-Pre: 4.12 L
Post FEV1/FVC ratio: 65 %
Post FEV6/FVC ratio: 100 %
Pre FEV1/FVC ratio: 56 %
Pre FEV6/FVC Ratio: 100 %
RV % pred: 103 %
RV: 2.38 L
TLC % pred: 94 %
TLC: 6.81 L

## 2021-10-24 NOTE — Progress Notes (Signed)
Full PFT performed today. °

## 2021-10-24 NOTE — Patient Instructions (Signed)
Full PFT performed today. °

## 2021-10-29 ENCOUNTER — Telehealth: Payer: Self-pay | Admitting: Hematology and Oncology

## 2021-10-29 NOTE — Telephone Encounter (Signed)
Scheduled appointment per providers template. Patient aware.

## 2021-11-10 ENCOUNTER — Inpatient Hospital Stay (HOSPITAL_BASED_OUTPATIENT_CLINIC_OR_DEPARTMENT_OTHER): Payer: 59 | Admitting: Hematology and Oncology

## 2021-11-10 ENCOUNTER — Ambulatory Visit: Payer: 59 | Admitting: Hematology and Oncology

## 2021-11-10 ENCOUNTER — Encounter: Payer: Self-pay | Admitting: Hematology and Oncology

## 2021-11-10 ENCOUNTER — Other Ambulatory Visit: Payer: 59

## 2021-11-10 ENCOUNTER — Inpatient Hospital Stay: Payer: 59 | Attending: Hematology and Oncology

## 2021-11-10 ENCOUNTER — Other Ambulatory Visit: Payer: Self-pay

## 2021-11-10 DIAGNOSIS — R591 Generalized enlarged lymph nodes: Secondary | ICD-10-CM | POA: Insufficient documentation

## 2021-11-10 DIAGNOSIS — C9110 Chronic lymphocytic leukemia of B-cell type not having achieved remission: Secondary | ICD-10-CM

## 2021-11-10 DIAGNOSIS — R161 Splenomegaly, not elsewhere classified: Secondary | ICD-10-CM | POA: Insufficient documentation

## 2021-11-10 DIAGNOSIS — Z8701 Personal history of pneumonia (recurrent): Secondary | ICD-10-CM | POA: Insufficient documentation

## 2021-11-10 DIAGNOSIS — Z8547 Personal history of malignant neoplasm of testis: Secondary | ICD-10-CM | POA: Insufficient documentation

## 2021-11-10 LAB — COMPREHENSIVE METABOLIC PANEL
ALT: 20 U/L (ref 0–44)
AST: 25 U/L (ref 15–41)
Albumin: 4.4 g/dL (ref 3.5–5.0)
Alkaline Phosphatase: 74 U/L (ref 38–126)
Anion gap: 7 (ref 5–15)
BUN: 30 mg/dL — ABNORMAL HIGH (ref 6–20)
CO2: 27 mmol/L (ref 22–32)
Calcium: 9.5 mg/dL (ref 8.9–10.3)
Chloride: 106 mmol/L (ref 98–111)
Creatinine, Ser: 1.03 mg/dL (ref 0.61–1.24)
GFR, Estimated: >60 mL/min (ref >60)
Glucose, Bld: 81 mg/dL (ref 70–99)
Potassium: 4.2 mmol/L (ref 3.5–5.1)
Sodium: 140 mmol/L (ref 135–145)
Total Bilirubin: 0.5 mg/dL (ref 0.3–1.2)
Total Protein: 6.7 g/dL (ref 6.5–8.1)

## 2021-11-10 LAB — CBC WITH DIFFERENTIAL/PLATELET
Abs Immature Granulocytes: 0.04 10*3/uL (ref 0.00–0.07)
Basophils Absolute: 0.1 10*3/uL (ref 0.0–0.1)
Basophils Relative: 0 %
Eosinophils Absolute: 0.1 10*3/uL (ref 0.0–0.5)
Eosinophils Relative: 0 %
HCT: 38.9 % — ABNORMAL LOW (ref 39.0–52.0)
Hemoglobin: 12.7 g/dL — ABNORMAL LOW (ref 13.0–17.0)
Immature Granulocytes: 0 %
Lymphocytes Relative: 82 %
Lymphs Abs: 23.5 10*3/uL — ABNORMAL HIGH (ref 0.7–4.0)
MCH: 28.6 pg (ref 26.0–34.0)
MCHC: 32.6 g/dL (ref 30.0–36.0)
MCV: 87.6 fL (ref 80.0–100.0)
Monocytes Absolute: 2.3 10*3/uL — ABNORMAL HIGH (ref 0.1–1.0)
Monocytes Relative: 8 %
Neutro Abs: 3 10*3/uL (ref 1.7–7.7)
Neutrophils Relative %: 10 %
Platelets: 149 10*3/uL — ABNORMAL LOW (ref 150–400)
RBC: 4.44 MIL/uL (ref 4.22–5.81)
RDW: 15.3 % (ref 11.5–15.5)
Smear Review: NORMAL
WBC: 29 10*3/uL — ABNORMAL HIGH (ref 4.0–10.5)
nRBC: 0 % (ref 0.0–0.2)

## 2021-11-10 NOTE — Progress Notes (Signed)
Harold Benton NOTE  Patient Care Team: Eulas Post, MD as PCP - General  CHIEF COMPLAINTS/PURPOSE OF CONSULTATION:   Lymphocytosis, follow up  ASSESSMENT & PLAN:  CLL (chronic lymphocytic leukemia) ([place]) This is a very pleasant 61 year old male patient with past medical history significant for testicular cancer referred to hematology for evaluation and treatment of CLL.  FISH for CLL showed 13 q. deletion consistent with favorable prognosis, IgH V somatic hyper mutation not detected. No concerning review of systems.  Physical examination without any palpable lymphadenopathy or hepatosplenomegaly.  We have reviewed the following indications for treatment. Treatment is indicated for patients with following disease related complications termed active disease.  Evidence of progressive marrow failure as manifested by development of worsening anemia or thrombocytopenia. Hemoglobin <10 g/dL or platelet count <100,000/microL are generally regarded as indications for treatment. However, platelet counts <100,000/microL may remain stable over a long period of time in some patients, and this situation does not automatically require treatment. Massive or progressive splenomegaly Massive or progressive or symptomatic lymphadenopathy Progressive lymphocytosis with an increase of > 50 % over two month period of LDT of less than 6 months ( softer indication) Constitutional symptoms. Symptomatic or functional extranodal involvement. At this time there is no indication for treatment.  I have recommended repeating labs today and return to clinic in 3 to 4 months.  He was encouraged to contact us with any new questions or concerns.  He expressed understanding of the recommendations.  Labs today reviewed, white blood cell count of 29,000, hemoglobin stable and platelet count has been stable.  CMP reviewed and unremarkable.  No orders of the defined types were placed in this  encounter.    HISTORY OF PRESENTING ILLNESS:   Harold Benton 61 y.o. male is here because of lymphocytosis.  This is a very pleasant 61 year old male patient with past medical history significant for testicular cancer, recent pneumonia in February 2022 with multiple courses of antibiotics referred to hematology for evaluation of lymphocytosis.   Flow confirmed monoclonal B-cell population. FISH suggested mono allelic deletion of 13 q. 14 no evidence of 17 P/ATM or trisomy 12 IGVH somatic hyper mutation was not detected.  Interim History  [name] is here for a follow up.  He continues to do well, stays very active, has been running regularly.  No B symptoms.  He denies any changes in breathing, bowel habits or urinary habits.  He was slightly concerned by the increase in white blood cell count.  He did have cold back in November for which she ended up needing antibiotics. Rest of the pertinent 10 point ROS reviewed and negative.  REVIEW OF SYSTEMS:   Constitutional: Denies fevers, chills or abnormal night sweats Eyes: Denies blurriness of vision, double vision or watery eyes Ears, nose, mouth, throat, and face: Denies mucositis or sore throat Respiratory: As mentioned above Cardiovascular: Denies palpitation, chest discomfort or lower extremity swelling Gastrointestinal:  Denies nausea, heartburn or change in bowel habits Skin: Denies abnormal skin rashes Lymphatics: Denies new lymphadenopathy or easy bruising Neurological:Denies numbness, tingling or new weaknesses Behavioral/Psych: Mood is stable, no new changes  All other systems were reviewed with the patient and are negative.  MEDICAL HISTORY:  Past Medical History:  Diagnosis Date   Testicular cancer ([place]) 1984    SURGICAL HISTORY: Past Surgical History:  Procedure Laterality Date   EXPLORATORY LAPAROTOMY  1984   tecticle removal  1984    SOCIAL HISTORY: Social History  Socioeconomic History   Marital status:  Married    Spouse name: Not on file   Number of children: Not on file   Years of education: Not on file   Highest education level: Not on file  Occupational History   Not on file  Tobacco Use   Smoking status: Never   Smokeless tobacco: Never  Substance and Sexual Activity   Alcohol use: Yes    Alcohol/week: 6.0 standard drinks    Types: 6 Cans of beer per week   Drug use: No   Sexual activity: Not on file  Other Topics Concern   Not on file  Social History Narrative   Not on file   Social Determinants of Health   Financial Resource Strain: Not on file  Food Insecurity: Not on file  Transportation Needs: Not on file  Physical Activity: Not on file  Stress: Not on file  Social Connections: Not on file  Intimate Partner Violence: Not on file    FAMILY HISTORY: Family History  Problem Relation Age of Onset   Cancer Mother        ?type   Cancer Father        CLL   Alcohol abuse Sister    Colon cancer Neg Hx    Stomach cancer Neg Hx     ALLERGIES:  has No Known Allergies.  MEDICATIONS:  Current Outpatient Medications  Medication Sig Dispense Refill   Ascorbic Acid (VITAMIN C) 1000 MG tablet Take 1,000 mg by mouth daily.     calcium carbonate (OS-CAL) 600 MG TABS Take 600 mg by mouth daily.     loratadine (CLARITIN) 10 MG tablet Take 10 mg by mouth daily.     Multiple Vitamin (MULTIVITAMIN) tablet Take 1 tablet by mouth daily.     sildenafil (REVATIO) 20 MG tablet TAKE 2-5 TABLETS BY MOUTH 1 HOUR PIRO TO SEXUAL ACTIVITY 50 tablet 5   No current facility-administered medications for this visit.    PHYSICAL EXAMINATION: ECOG PERFORMANCE STATUS: 0 - Asymptomatic  Vitals:   11/10/21 1147  BP: (!) 157/71  Pulse: (!) 48  Resp: 17  Temp: (!) 97.3 F (36.3 C)  SpO2: 100%    Filed Weights   11/10/21 1147  Weight: 145 lb 11.2 oz (66.1 kg)   Physical Exam Constitutional:      Appearance: Normal appearance.  HENT:     Head: Normocephalic and atraumatic.   Cardiovascular:     Rate and Rhythm: Normal rate and regular rhythm.     Pulses: Normal pulses.     Heart sounds: Normal heart sounds.  Pulmonary:     Effort: Pulmonary effort is normal.     Breath sounds: Normal breath sounds.  Abdominal:     General: Abdomen is flat. There is no distension.     Palpations: Abdomen is soft. There is no mass.     Tenderness: There is no abdominal tenderness.  Musculoskeletal:        General: No swelling or tenderness.     Cervical back: Normal range of motion and neck supple. No rigidity.  Lymphadenopathy:     Cervical: No cervical adenopathy.  Skin:    General: Skin is warm and dry.  Neurological:     General: No focal deficit present.     Mental Status: He is alert.  Psychiatric:        Mood and Affect: Mood normal.    LABORATORY DATA:  I have reviewed the data as listed Lab  Results  Component Value Date   WBC 29.0 (H) 11/10/2021   HGB 12.7 (L) 11/10/2021   HCT 38.9 (L) 11/10/2021   MCV 87.6 11/10/2021   PLT 149 (L) 11/10/2021     Chemistry      Component Value Date/Time   NA 140 11/10/2021 1128   K 4.2 11/10/2021 1128   CL 106 11/10/2021 1128   CO2 27 11/10/2021 1128   BUN 30 (H) 11/10/2021 1128   CREATININE 1.03 11/10/2021 1128   CREATININE 0.97 05/19/2021 0915   CREATININE 1.10 07/22/2020 1358      Component Value Date/Time   CALCIUM 9.5 11/10/2021 1128   ALKPHOS 74 11/10/2021 1128   AST 25 11/10/2021 1128   AST 26 05/19/2021 0915   ALT 20 11/10/2021 1128   ALT 26 05/19/2021 0915   BILITOT 0.5 11/10/2021 1128   BILITOT 0.8 05/19/2021 0915     Flow consistent with CLL, 13 q deletion, favorable prognosis.  RADIOGRAPHIC STUDIES: I have personally reviewed the radiological images as listed and agreed with the findings in the report. DG Chest 2 View  Result Date: 10/20/2021 CLINICAL DATA:  Short of breath, history of pneumonia EXAM: CHEST - 2 VIEW COMPARISON:  01/30/2021, 02/14/2021 FINDINGS: Frontal and lateral  views of the chest demonstrate an unremarkable cardiac silhouette. No acute airspace disease, effusion, or pneumothorax. No acute bony abnormalities. IMPRESSION: 1. No acute intrathoracic process. Electronically Signed   By: Randa Ngo M.D.   On: 10/20/2021 11:10    All questions were answered. The patient knows to call the clinic with any problems, questions or concerns. I spent 20 minutes in the care of this patient including H and P, review of records, counseling and coordination of care.     Benay Pike, MD 11/10/2021 12:52 PM

## 2021-11-10 NOTE — Assessment & Plan Note (Signed)
This is a very pleasant 61 year old male patient with past medical history significant for testicular cancer referred to hematology for evaluation and treatment of CLL.  FISH for CLL showed 13 q. deletion consistent with favorable prognosis, IgH V somatic hyper mutation not detected. No concerning review of systems.  Physical examination without any palpable lymphadenopathy or hepatosplenomegaly.  We have reviewed the following indications for treatment. Treatment is indicated for patients with following disease related complications termed active disease.  1. Evidence of progressive marrow failure as manifested by development of worsening anemia or thrombocytopenia. Hemoglobin <10 g/dL or platelet count <100,000/microL are generally regarded as indications for treatment. However, platelet counts <100,000/microL may remain stable over a long period of time in some patients, and this situation does not automatically require treatment. 2. Massive or progressive splenomegaly 3. Massive or progressive or symptomatic lymphadenopathy 4. Progressive lymphocytosis with an increase of > 50 % over two month period of LDT of less than 6 months ( softer indication) 5. Constitutional symptoms. 6. Symptomatic or functional extranodal involvement. At this time there is no indication for treatment.  I have recommended repeating labs today and return to clinic in 3 to 4 months.  He was encouraged to contact us with any new questions or concerns.  He expressed understanding of the recommendations.  Labs today reviewed, white blood cell count of 29,000, hemoglobin stable and platelet count has been stable.  CMP reviewed and unremarkable.

## 2021-11-14 ENCOUNTER — Other Ambulatory Visit: Payer: 59

## 2021-11-14 ENCOUNTER — Ambulatory Visit: Payer: 59 | Admitting: Hematology and Oncology

## 2021-12-03 ENCOUNTER — Encounter: Payer: Self-pay | Admitting: Pulmonary Disease

## 2021-12-04 NOTE — Telephone Encounter (Signed)
On PFT result note it was noted that the pt responds to bronchodilator and would benefit being on maintenance inhaler. Will need to send to Dr. Erin Fulling what inhaler he should be on after getting message back from pt regarding his s/s.

## 2021-12-05 ENCOUNTER — Other Ambulatory Visit (HOSPITAL_COMMUNITY): Payer: Self-pay

## 2021-12-05 ENCOUNTER — Other Ambulatory Visit: Payer: Self-pay

## 2021-12-05 ENCOUNTER — Telehealth: Payer: Self-pay

## 2021-12-05 MED ORDER — AMOXICILLIN-POT CLAVULANATE 875-125 MG PO TABS: 1.0000 | ORAL_TABLET | Freq: Two times a day (BID) | ORAL | 0 refills | Status: DC

## 2021-12-05 MED ORDER — FLUTICASONE FUROATE-VILANTEROL 100-25 MCG/ACT IN AEPB: 1.0000 | INHALATION_SPRAY | Freq: Every day | RESPIRATORY_TRACT | 2 refills | Status: DC

## 2021-12-05 NOTE — Telephone Encounter (Signed)
Patient is aware of below message and voiced his understanding.  Nothing further needed.   

## 2021-12-05 NOTE — Telephone Encounter (Signed)
Please refer to 12/03/2021 mychart message.   Memory Dance is not affordable.   Pharmacy team, can you guys assist? Thanks

## 2021-12-26 ENCOUNTER — Encounter: Payer: Self-pay | Admitting: Family Medicine

## 2022-01-12 ENCOUNTER — Ambulatory Visit (INDEPENDENT_AMBULATORY_CARE_PROVIDER_SITE_OTHER): Payer: 59 | Admitting: Family Medicine

## 2022-01-12 ENCOUNTER — Other Ambulatory Visit: Payer: Self-pay

## 2022-01-12 VITALS — BP 142/68 | HR 53 | Temp 97.5°F | Ht 71.0 in | Wt 146.6 lb

## 2022-01-12 DIAGNOSIS — K4090 Unilateral inguinal hernia, without obstruction or gangrene, not specified as recurrent: Secondary | ICD-10-CM

## 2022-01-12 NOTE — Progress Notes (Signed)
? ?Established Patient Office Visit ? ?Subjective:  ?Patient ID: Harold Benton, male    DOB: 1961-07-10  Age: 61 y.o. MRN: [id number] ? ?CC:  ?Chief Complaint  ?Patient presents with  ? Hernia  ? ? ?HPI ?Harold Benton presents for possible left inguinal hernia.  First noted a bulge in this region about 6 weeks ago.  He does have frequent coughing but denies any specific injury.  He has noticed with activity such as coughing this seems to bulge further.  No prior history of hernia. ? ?He has history of CLL followed by hematology and remote history of testicular cancer.  He has history of recurrent sinusitis as well as asthma.  Followed by pulmonary.  Frequent upper respiratory infections.  Does have some cough currently which started several days ago but no fever. ? ?Past Medical History:  ?Diagnosis Date  ? Testicular cancer ([place]) 1984  ? ? ?Past Surgical History:  ?Procedure Laterality Date  ? EXPLORATORY LAPAROTOMY  1984  ? tecticle removal  1984  ? ? ?Family History  ?Problem Relation Age of Onset  ? Cancer Mother   ?     ?type  ? Cancer Father   ?     CLL  ? Alcohol abuse Sister   ? Colon cancer Neg Hx   ? Stomach cancer Neg Hx   ? ? ?Social History  ? ?Socioeconomic History  ? Marital status: Married  ?  Spouse name: Not on file  ? Number of children: Not on file  ? Years of education: Not on file  ? Highest education level: Bachelor's degree (e.g., BA, AB, BS)  ?Occupational History  ? Not on file  ?Tobacco Use  ? Smoking status: Never  ? Smokeless tobacco: Never  ?Substance and Sexual Activity  ? Alcohol use: Yes  ?  Alcohol/week: 6.0 standard drinks  ?  Types: 6 Cans of beer per week  ? Drug use: No  ? Sexual activity: Not on file  ?Other Topics Concern  ? Not on file  ?Social History Narrative  ? Not on file  ? ?Social Determinants of Health  ? ?Financial Resource Strain: Low Risk   ? Difficulty of Paying Living Expenses: Not hard at all  ?Food Insecurity: No Food Insecurity  ? Worried About Ship broker in the Last Year: Never true  ? Ran Out of Food in the Last Year: Never true  ?Transportation Needs: No Transportation Needs  ? Lack of Transportation (Medical): No  ? Lack of Transportation (Non-Medical): No  ?Physical Activity: Sufficiently Active  ? Days of Exercise per Week: 5 days  ? Minutes of Exercise per Session: 40 min  ?Stress: No Stress Concern Present  ? Feeling of Stress : Not at all  ?Social Connections: Socially Integrated  ? Frequency of Communication with Friends and Family: More than three times a week  ? Frequency of Social Gatherings with Friends and Family: More than three times a week  ? Attends Religious Services: More than 4 times per year  ? Active Member of Clubs or Organizations: Yes  ? Attends Archivist Meetings: More than 4 times per year  ? Marital Status: Married  ?Intimate Partner Violence: Not on file  ? ? ?Outpatient Medications Prior to Visit  ?Medication Sig Dispense Refill  ? Ascorbic Acid (VITAMIN C) 1000 MG tablet Take 1,000 mg by mouth daily.    ? calcium carbonate (OS-CAL) 600 MG TABS Take 600 mg by mouth daily.    ?  loratadine (CLARITIN) 10 MG tablet Take 10 mg by mouth daily.    ? Multiple Vitamin (MULTIVITAMIN) tablet Take 1 tablet by mouth daily.    ? sildenafil (REVATIO) 20 MG tablet TAKE 2-5 TABLETS BY MOUTH 1 HOUR PIRO TO SEXUAL ACTIVITY 50 tablet 5  ? amoxicillin-clavulanate (AUGMENTIN) 875-125 MG tablet Take 1 tablet by mouth 2 (two) times daily. 14 tablet 0  ? fluticasone furoate-vilanterol (BREO ELLIPTA) 100-25 MCG/ACT AEPB Inhale 1 puff into the lungs daily. 28 each 2  ? ?No facility-administered medications prior to visit.  ? ? ?No Known Allergies ? ?ROS ?Review of Systems  ?Constitutional:  Negative for appetite change, chills, fever and unexpected weight change.  ?Gastrointestinal:  Negative for nausea and vomiting.  ? ?  ?Objective:  ?  ?Physical Exam ?Vitals reviewed.  ?Constitutional:   ?   Appearance: Normal appearance.  ?Cardiovascular:   ?   Rate and Rhythm: Normal rate and regular rhythm.  ?Pulmonary:  ?   Effort: Pulmonary effort is normal.  ?   Breath sounds: Normal breath sounds. No wheezing or rales.  ?Genitourinary: ?   Comments: Left inguinal hernia.  This is soft and nontender and easily reducible ?Neurological:  ?   Mental Status: He is alert.  ? ? ?BP (!) 142/68 (BP Location: Left Arm, Patient Position: Sitting, Cuff Size: Normal)   Pulse (!) 53   Temp (!) 97.5 ?F (36.4 ?C) (Axillary)   Ht '5\' 11"'$  (1.803 m)   Wt 146 lb 9.6 oz (66.5 kg)   SpO2 99%   BMI 20.45 kg/m?  ?Wt Readings from Last 3 Encounters:  ?01/12/22 146 lb 9.6 oz (66.5 kg)  ?11/10/21 145 lb 11.2 oz (66.1 kg)  ?10/20/21 144 lb 9.6 oz (65.6 kg)  ? ? ? ?Health Maintenance Due  ?Topic Date Due  ? HIV Screening  Never done  ? Zoster Vaccines- Shingrix (1 of 2) Never done  ? COVID-19 Vaccine (4 - Booster for Pfizer series) 12/17/2020  ? COLONOSCOPY (Pts 45-27yr Insurance coverage will need to be confirmed)  10/01/2021  ? ? ?There are no preventive care reminders to display for this patient. ? ?Lab Results  ?Component Value Date  ? TSH 2.11 09/05/2021  ? ?Lab Results  ?Component Value Date  ? WBC 29.0 (H) 11/10/2021  ? HGB 12.7 (L) 11/10/2021  ? HCT 38.9 (L) 11/10/2021  ? MCV 87.6 11/10/2021  ? PLT 149 (L) 11/10/2021  ? ?Lab Results  ?Component Value Date  ? NA 140 11/10/2021  ? K 4.2 11/10/2021  ? CO2 27 11/10/2021  ? GLUCOSE 81 11/10/2021  ? BUN 30 (H) 11/10/2021  ? CREATININE 1.03 11/10/2021  ? BILITOT 0.5 11/10/2021  ? ALKPHOS 74 11/10/2021  ? AST 25 11/10/2021  ? ALT 20 11/10/2021  ? PROT 6.7 11/10/2021  ? ALBUMIN 4.4 11/10/2021  ? CALCIUM 9.5 11/10/2021  ? ANIONGAP 7 11/10/2021  ? GFR 92.98 03/25/2021  ? ?Lab Results  ?Component Value Date  ? CHOL 176 09/05/2021  ? ?Lab Results  ?Component Value Date  ? HDL 51.60 09/05/2021  ? ?Lab Results  ?Component Value Date  ? LDLCALC 108 (H) 09/05/2021  ? ?Lab Results  ?Component Value Date  ? TRIG 86.0 09/05/2021  ? ?Lab Results   ?Component Value Date  ? CHOLHDL 3 09/05/2021  ? ?No results found for: HGBA1C ? ?  ?Assessment & Plan:  ? ?Problem List Items Addressed This Visit   ?None ?Visit Diagnoses   ? ? Left inguinal  hernia    -  Primary  ? Relevant Orders  ? Ambulatory referral to General Surgery  ? ?  ?Patient has a left inguinal hernia.  No signs or symptoms of strangulation.  First noted about 6 weeks ago.  He would like to explore surgical consult to discuss repair.  He stays very active generally. ?-Set up general surgery consult ? ?No orders of the defined types were placed in this encounter. ? ? ?Follow-up: No follow-ups on file.  ? ? ?Carolann Littler, MD ?

## 2022-01-12 NOTE — Patient Instructions (Signed)
I will put in surgical referral.  ?

## 2022-01-16 ENCOUNTER — Encounter: Payer: Self-pay | Admitting: Pulmonary Disease

## 2022-01-20 ENCOUNTER — Encounter: Payer: Self-pay | Admitting: Family Medicine

## 2022-01-20 MED ORDER — AMOXICILLIN-POT CLAVULANATE 875-125 MG PO TABS: 1.0000 | ORAL_TABLET | Freq: Two times a day (BID) | ORAL | 0 refills | Status: DC

## 2022-01-26 ENCOUNTER — Encounter: Payer: Self-pay | Admitting: Pulmonary Disease

## 2022-01-26 ENCOUNTER — Other Ambulatory Visit: Payer: Self-pay

## 2022-01-26 ENCOUNTER — Ambulatory Visit (INDEPENDENT_AMBULATORY_CARE_PROVIDER_SITE_OTHER): Payer: 59 | Admitting: Pulmonary Disease

## 2022-01-26 VITALS — BP 128/62 | HR 60 | Ht 71.0 in | Wt 148.4 lb

## 2022-01-26 DIAGNOSIS — J4520 Mild intermittent asthma, uncomplicated: Secondary | ICD-10-CM

## 2022-01-26 DIAGNOSIS — J329 Chronic sinusitis, unspecified: Secondary | ICD-10-CM | POA: Diagnosis not present

## 2022-01-26 MED ORDER — FLUTICASONE PROPIONATE 50 MCG/ACT NA SUSP: 1.0000 | Freq: Every day | NASAL | 2 refills | Status: DC

## 2022-01-26 MED ORDER — ALBUTEROL SULFATE HFA 108 (90 BASE) MCG/ACT IN AERS: 2.0000 | INHALATION_SPRAY | Freq: Four times a day (QID) | RESPIRATORY_TRACT | 6 refills | Status: DC | PRN

## 2022-01-26 MED ORDER — IPRATROPIUM BROMIDE 0.03 % NA SOLN: 2.0000 | Freq: Two times a day (BID) | NASAL | 12 refills | Status: DC

## 2022-01-26 NOTE — Patient Instructions (Addendum)
Your bouts of cough, chest congestion and shortness of breath are related to reactive airways or COPD.  ? ?Start Breo ellipta sample 1 puff daily ?- rinse mouth out after each use ? ?I have messaged our pharmacy team to check on cost of other inhalers in this category. We will let you know the response once we hear from them. ? ?Start fluticasone nasasl spray, 2 sprays per nostril daily ? ?Start ipratropium nasal spray, 2 sprays per nostril twice daily ?- can use as needed after 1-2 weeks ? ?Continue loratidine daily ? ?Follow up in 6 months ?

## 2022-01-26 NOTE — Progress Notes (Signed)
? ?Synopsis: Referred in May 2022 for cough by Carolann Littler, MD ? ?Subjective:  ? ?PATIENT ID: Harold Benton GENDER: male DOB: Mar 20, 1961, MRN: [id number] ? ?HPI ? ?Chief Complaint  ?Patient presents with  ? Follow-up  ?  3 mo f/u for SOB. Has not started the abx yet. Still has some chest congestion, green phlegm.   ? ?Harold Benton is a 61 year old male, never smoker with history of testicular cancer who returns to pulmonary clinic for reactive airways disease.  ? ?PFTs 10/24/21 showed mild obstructive defect with significant bronchodilator response. Breo was prescribed but it was $300 and he has not started inhaler therapy.  ? ?He is experiencing increased sinus and chest congestion. He denies sinus pressure or pain. He is having some increased dyspnea. Denies wheezing. He was prescribed augmentin 3/17 but did not start this treatment yet. He is using afrin nasal spray at bedtime. He is not using any other nasal sprays at this time. He is using claritin and netty pot daily.  ? ?OV 10/20/21 ?He was referred to hematology for leukocytosis at last visit and diagnosed with CLL. He does not require active treatment at this time and they are monitoring his blood counts regularly.  ? ?He was treated with augmentin 11/25 after experiencing sinus and chest congestion. He had covid 19 infection in 04/2021.  ? ?There was concern for possible organizing pneumonia based on his CT Chest findings from 01/2021 and steroid taper was completed over a 4 week period. ? ?OV 03/25/21 ?He reports having a cough since February 2022 where he was diagnosed with left lower lobe pneumonia and treated with augmentin and azithromycin. He initially had improvement clinically and radiographicly on repeat chest radiogrph on 3/11 and 3/31. The cough persisted but worsened again in April where he was treated with a course of levaquin. A CT Chest was done on 02/14/21 which showed airspace disease of the inferior right middle lobe, lingula, right lower  lobe and most prominantly in the left lower lobe. There is bronchial wall thickening in the lower lobes with mucous plugging in the bilateral lower lobe airways. Also noted is mildly prominent bilateral axially lymph nodes. ? ?He continues to cough despite the course of levaquin. He is coughing up clear to yellowish sputum. He does have intermittent wheezing. ? ?He also reports right sided neck discomfort and stiffness with limited range of motion of his neck. He also complains of muscle cramping in his calfs and right knee soreness.  ? ?His paternal gradparents had emphysema and his father has history of CLL.  ? ?Past Medical History:  ?Diagnosis Date  ? Testicular cancer ([place]) 1984  ?  ? ?Family History  ?Problem Relation Age of Onset  ? Cancer Mother   ?     ?type  ? Cancer Father   ?     CLL  ? Alcohol abuse Sister   ? Colon cancer Neg Hx   ? Stomach cancer Neg Hx   ?  ? ?Social History  ? ?Socioeconomic History  ? Marital status: Married  ?  Spouse name: Not on file  ? Number of children: Not on file  ? Years of education: Not on file  ? Highest education level: Bachelor's degree (e.g., BA, AB, BS)  ?Occupational History  ? Not on file  ?Tobacco Use  ? Smoking status: Never  ? Smokeless tobacco: Never  ?Substance and Sexual Activity  ? Alcohol use: Yes  ?  Alcohol/week: 6.0 standard drinks  ?  Types: 6 Cans of beer per week  ? Drug use: No  ? Sexual activity: Not on file  ?Other Topics Concern  ? Not on file  ?Social History Narrative  ? Not on file  ? ?Social Determinants of Health  ? ?Financial Resource Strain: Low Risk   ? Difficulty of Paying Living Expenses: Not hard at all  ?Food Insecurity: No Food Insecurity  ? Worried About Charity fundraiser in the Last Year: Never true  ? Ran Out of Food in the Last Year: Never true  ?Transportation Needs: No Transportation Needs  ? Lack of Transportation (Medical): No  ? Lack of Transportation (Non-Medical): No  ?Physical Activity: Sufficiently Active  ? Days of  Exercise per Week: 5 days  ? Minutes of Exercise per Session: 40 min  ?Stress: No Stress Concern Present  ? Feeling of Stress : Not at all  ?Social Connections: Socially Integrated  ? Frequency of Communication with Friends and Family: More than three times a week  ? Frequency of Social Gatherings with Friends and Family: More than three times a week  ? Attends Religious Services: More than 4 times per year  ? Active Member of Clubs or Organizations: Yes  ? Attends Archivist Meetings: More than 4 times per year  ? Marital Status: Married  ?Intimate Partner Violence: Not on file  ?  ? ?No Known Allergies  ? ?Outpatient Medications Prior to Visit  ?Medication Sig Dispense Refill  ? Ascorbic Acid (VITAMIN C) 1000 MG tablet Take 1,000 mg by mouth daily.    ? calcium carbonate (OS-CAL) 600 MG TABS Take 600 mg by mouth daily.    ? loratadine (CLARITIN) 10 MG tablet Take 10 mg by mouth daily.    ? Multiple Vitamin (MULTIVITAMIN) tablet Take 1 tablet by mouth daily.    ? sildenafil (REVATIO) 20 MG tablet TAKE 2-5 TABLETS BY MOUTH 1 HOUR PIRO TO SEXUAL ACTIVITY 50 tablet 5  ? amoxicillin-clavulanate (AUGMENTIN) 875-125 MG tablet Take 1 tablet by mouth 2 (two) times daily. (Patient not taking: Reported on 01/26/2022) 14 tablet 0  ? ?No facility-administered medications prior to visit.  ? ? ?Review of Systems  ?Constitutional:  Negative for chills, fever, malaise/fatigue and weight loss.  ?HENT:  Positive for congestion. Negative for sinus pain and sore throat.   ?Eyes: Negative.   ?Respiratory:  Positive for cough, sputum production and shortness of breath. Negative for hemoptysis and wheezing.   ?Cardiovascular:  Negative for chest pain, palpitations, orthopnea, claudication and leg swelling.  ?Gastrointestinal:  Negative for abdominal pain, heartburn, nausea and vomiting.  ?Genitourinary: Negative.   ?Musculoskeletal:  Negative for myalgias.  ?Skin:  Negative for rash.  ?Neurological:  Negative for weakness.   ?Endo/Heme/Allergies: Negative.   ?Psychiatric/Behavioral: Negative.    ? ?Objective:  ? ?Vitals:  ? 01/26/22 0855  ?BP: 128/62  ?Pulse: 60  ?SpO2: 100%  ?Weight: 148 lb 6.4 oz (67.3 kg)  ?Height: '5\' 11"'$  (1.803 m)  ? ?Physical Exam ?Constitutional:   ?   General: He is not in acute distress. ?   Appearance: Normal appearance.  ?HENT:  ?   Head: Normocephalic and atraumatic.  ?Eyes:  ?   Conjunctiva/sclera: Conjunctivae normal.  ?Cardiovascular:  ?   Rate and Rhythm: Normal rate and regular rhythm.  ?   Pulses: Normal pulses.  ?   Heart sounds: Normal heart sounds. No murmur heard. ?Pulmonary:  ?   Effort: Pulmonary effort is normal.  ?  Breath sounds: Decreased breath sounds present. No wheezing or rhonchi.  ?Musculoskeletal:  ?   Right lower leg: No edema.  ?   Left lower leg: No edema.  ?Skin: ?   General: Skin is warm and dry.  ?Neurological:  ?   General: No focal deficit present.  ?   Mental Status: He is alert.  ?Psychiatric:     ?   Mood and Affect: Mood normal.     ?   Behavior: Behavior normal.     ?   Thought Content: Thought content normal.     ?   Judgment: Judgment normal.  ? ?CBC ?   ?Component Value Date/Time  ? WBC 29.0 (H) 11/10/2021 1128  ? RBC 4.44 11/10/2021 1128  ? HGB 12.7 (L) 11/10/2021 1128  ? HGB 13.3 05/19/2021 0915  ? HCT 38.9 (L) 11/10/2021 1128  ? PLT 149 (L) 11/10/2021 1128  ? PLT 127 (L) 05/19/2021 0915  ? MCV 87.6 11/10/2021 1128  ? MCH 28.6 11/10/2021 1128  ? MCHC 32.6 11/10/2021 1128  ? RDW 15.3 11/10/2021 1128  ? LYMPHSABS 23.5 (H) 11/10/2021 1128  ? MONOABS 2.3 (H) 11/10/2021 1128  ? EOSABS 0.1 11/10/2021 1128  ? BASOSABS 0.1 11/10/2021 1128  ? ? ?  Latest Ref Rng & Units 11/10/2021  ? 11:28 AM 07/14/2021  ? 10:07 AM 05/19/2021  ?  9:15 AM  ?BMP  ?Glucose 70 - 99 mg/dL 81   92   87    ?BUN 6 - 20 mg/dL '30   29   20    '$ ?Creatinine 0.61 - 1.24 mg/dL 1.03   0.95   0.97    ?Sodium 135 - 145 mmol/L 140   143   143    ?Potassium 3.5 - 5.1 mmol/L 4.2   4.4   3.9    ?Chloride 98 - 111  mmol/L 106   110   107    ?CO2 22 - 32 mmol/L '27   25   26    '$ ?Calcium 8.9 - 10.3 mg/dL 9.5   9.7   9.4    ? ?Chest imaging: ?CT Chest wo contrast 02/14/21 ?Airspace disease in the lower lungs bilaterally as desc

## 2022-01-27 ENCOUNTER — Other Ambulatory Visit (HOSPITAL_COMMUNITY): Payer: Self-pay

## 2022-01-27 MED ORDER — FLUTICASONE FUROATE-VILANTEROL 100-25 MCG/ACT IN AEPB: 1.0000 | INHALATION_SPRAY | Freq: Every day | RESPIRATORY_TRACT | 0 refills | Status: DC

## 2022-01-27 NOTE — Addendum Note (Signed)
Addended by: Valerie Salts on: 01/27/2022 09:41 AM ? ? Modules accepted: Orders ? ?

## 2022-03-02 ENCOUNTER — Inpatient Hospital Stay: Payer: 59

## 2022-03-02 ENCOUNTER — Inpatient Hospital Stay: Payer: 59 | Admitting: Hematology and Oncology

## 2022-03-25 ENCOUNTER — Ambulatory Visit: Payer: 59 | Admitting: Hematology and Oncology

## 2022-03-25 ENCOUNTER — Other Ambulatory Visit: Payer: 59

## 2022-04-07 ENCOUNTER — Inpatient Hospital Stay: Payer: 59 | Attending: Hematology and Oncology

## 2022-04-07 ENCOUNTER — Inpatient Hospital Stay (HOSPITAL_BASED_OUTPATIENT_CLINIC_OR_DEPARTMENT_OTHER): Payer: 59 | Admitting: Hematology and Oncology

## 2022-04-07 ENCOUNTER — Other Ambulatory Visit: Payer: Self-pay

## 2022-04-07 ENCOUNTER — Encounter: Payer: Self-pay | Admitting: Hematology and Oncology

## 2022-04-07 DIAGNOSIS — Z809 Family history of malignant neoplasm, unspecified: Secondary | ICD-10-CM | POA: Insufficient documentation

## 2022-04-07 DIAGNOSIS — C9110 Chronic lymphocytic leukemia of B-cell type not having achieved remission: Secondary | ICD-10-CM

## 2022-04-07 DIAGNOSIS — Z8547 Personal history of malignant neoplasm of testis: Secondary | ICD-10-CM | POA: Insufficient documentation

## 2022-04-07 DIAGNOSIS — F1090 Alcohol use, unspecified, uncomplicated: Secondary | ICD-10-CM | POA: Diagnosis not present

## 2022-04-07 DIAGNOSIS — Z8701 Personal history of pneumonia (recurrent): Secondary | ICD-10-CM | POA: Insufficient documentation

## 2022-04-07 DIAGNOSIS — Z806 Family history of leukemia: Secondary | ICD-10-CM | POA: Diagnosis not present

## 2022-04-07 LAB — COMPREHENSIVE METABOLIC PANEL
ALT: 15 U/L (ref 0–44)
AST: 23 U/L (ref 15–41)
Albumin: 4.3 g/dL (ref 3.5–5.0)
Alkaline Phosphatase: 75 U/L (ref 38–126)
Anion gap: 5 (ref 5–15)
BUN: 21 mg/dL (ref 8–23)
CO2: 28 mmol/L (ref 22–32)
Calcium: 9.5 mg/dL (ref 8.9–10.3)
Chloride: 106 mmol/L (ref 98–111)
Creatinine, Ser: 0.93 mg/dL (ref 0.61–1.24)
GFR, Estimated: >60 mL/min (ref >60)
Glucose, Bld: 88 mg/dL (ref 70–99)
Potassium: 3.6 mmol/L (ref 3.5–5.1)
Sodium: 139 mmol/L (ref 135–145)
Total Bilirubin: 0.5 mg/dL (ref 0.3–1.2)
Total Protein: 6.9 g/dL (ref 6.5–8.1)

## 2022-04-07 LAB — CBC WITH DIFFERENTIAL/PLATELET
Abs Immature Granulocytes: 0.02 10*3/uL (ref 0.00–0.07)
Basophils Absolute: 0.1 10*3/uL (ref 0.0–0.1)
Basophils Relative: 0 %
Eosinophils Absolute: 0.1 10*3/uL (ref 0.0–0.5)
Eosinophils Relative: 0 %
HCT: 40.3 % (ref 39.0–52.0)
Hemoglobin: 13.1 g/dL (ref 13.0–17.0)
Immature Granulocytes: 0 %
Lymphocytes Relative: 83 %
Lymphs Abs: 21.6 10*3/uL — ABNORMAL HIGH (ref 0.7–4.0)
MCH: 28.7 pg (ref 26.0–34.0)
MCHC: 32.5 g/dL (ref 30.0–36.0)
MCV: 88.2 fL (ref 80.0–100.0)
Monocytes Absolute: 1.8 10*3/uL — ABNORMAL HIGH (ref 0.1–1.0)
Monocytes Relative: 7 %
Neutro Abs: 2.7 10*3/uL (ref 1.7–7.7)
Neutrophils Relative %: 10 %
Platelets: 140 10*3/uL — ABNORMAL LOW (ref 150–400)
RBC: 4.57 MIL/uL (ref 4.22–5.81)
RDW: 15.9 % — ABNORMAL HIGH (ref 11.5–15.5)
Smear Review: NORMAL
WBC: 26.3 10*3/uL — ABNORMAL HIGH (ref 4.0–10.5)
nRBC: 0 % (ref 0.0–0.2)

## 2022-04-07 NOTE — Progress Notes (Signed)
[facility] CONSULT NOTE  Patient Care Team: Burchette, Bruce W, MD as PCP - General  CHIEF COMPLAINTS/PURPOSE OF CONSULTATION:   Lymphocytosis, follow up  ASSESSMENT & PLAN:  CLL (chronic lymphocytic leukemia) (HCC) This is a very pleasant 60-year-old male patient with past medical history significant for testicular cancer referred to hematology for evaluation and treatment of CLL.  FISH for CLL showed 13 q. deletion consistent with favorable prognosis, IgH V somatic hyper mutation not detected. No concerning review of systems.  He remains clinically asymptomatic.  Physical examination unremarkable, no palpable lymphadenopathy or hepatosplenomegaly.  He does not appear to have any active disease at this time.  CBC showed stable leukocytosis, no evidence of anemia, mild thrombocytopenia.  He can return to clinic for follow-up in 3 to 4 months or sooner as needed.  No orders of the defined types were placed in this encounter.   HISTORY OF PRESENTING ILLNESS:   Harold Benton 61 y.o. male is here because of lymphocytosis.  This is a very pleasant 60-year-old male patient with past medical history significant for testicular cancer, recent pneumonia in February 2022 with multiple courses of antibiotics referred to hematology for evaluation of lymphocytosis.   Flow confirmed monoclonal B-cell population. FISH suggested mono allelic deletion of 13 q. 14 no evidence of 17 P/ATM or trisomy 12 IGVH somatic hyper mutation was not detected.  Interim History  Harold Benton is here for a follow up.  He continues to remain asymptomatic.  He denies any fevers, drenching night sweats, loss of appetite or loss of weight.  He had an episode of bronchitis earlier this year and required antibiotics.  No hospitalization needed. He is otherwise active, no change in breathing, bowel habits or urinary habits Rest of the pertinent 10 point ROS reviewed and negative.  REVIEW OF SYSTEMS:    Constitutional: Denies fevers, chills or abnormal night sweats Eyes: Denies blurriness of vision, double vision or watery eyes Ears, nose, mouth, throat, and face: Denies mucositis or sore throat Respiratory: As mentioned above Cardiovascular: Denies palpitation, chest discomfort or lower extremity swelling Gastrointestinal:  Denies nausea, heartburn or change in bowel habits Skin: Denies abnormal skin rashes Lymphatics: Denies new lymphadenopathy or easy bruising Neurological:Denies numbness, tingling or new weaknesses Behavioral/Psych: Mood is stable, no new changes  All other systems were reviewed with the patient and are negative.  MEDICAL HISTORY:  Past Medical History:  Diagnosis Date   Testicular cancer (HCC) 1984    SURGICAL HISTORY: Past Surgical History:  Procedure Laterality Date   EXPLORATORY LAPAROTOMY  1984   tecticle removal  1984    SOCIAL HISTORY: Social History   Socioeconomic History   Marital status: Married    Spouse name: Not on file   Number of children: Not on file   Years of education: Not on file   Highest education level: Bachelor's degree (e.g., BA, AB, BS)  Occupational History   Not on file  Tobacco Use   Smoking status: Never   Smokeless tobacco: Never  Substance and Sexual Activity   Alcohol use: Yes    Alcohol/week: 6.0 standard drinks    Types: 6 Cans of beer per week   Drug use: No   Sexual activity: Not on file  Other Topics Concern   Not on file  Social History Narrative   Not on file   Social Determinants of Health   Financial Resource Strain: Low Risk    Difficulty of Paying Living Expenses: Not hard at   all  Food Insecurity: No Food Insecurity   Worried About Running Out of Food in the Last Year: Never true   Ran Out of Food in the Last Year: Never true  Transportation Needs: No Transportation Needs   Lack of Transportation (Medical): No   Lack of Transportation (Non-Medical): No  Physical Activity: Sufficiently  Active   Days of Exercise per Week: 5 days   Minutes of Exercise per Session: 40 min  Stress: No Stress Concern Present   Feeling of Stress : Not at all  Social Connections: Socially Integrated   Frequency of Communication with Friends and Family: More than three times a week   Frequency of Social Gatherings with Friends and Family: More than three times a week   Attends Religious Services: More than 4 times per year   Active Member of Clubs or Organizations: Yes   Attends Club or Organization Meetings: More than 4 times per year   Marital Status: Married  Intimate Partner Violence: Not on file    FAMILY HISTORY: Family History  Problem Relation Age of Onset   Cancer Mother        ?type   Cancer Father        CLL   Alcohol abuse Sister    Colon cancer Neg Hx    Stomach cancer Neg Hx     ALLERGIES:  has No Known Allergies.  MEDICATIONS:  Current Outpatient Medications  Medication Sig Dispense Refill   albuterol (VENTOLIN HFA) 108 (90 Base) MCG/ACT inhaler Inhale 2 puffs into the lungs every 6 (six) hours as needed for wheezing or shortness of breath. 8 g 6   Ascorbic Acid (VITAMIN C) 1000 MG tablet Take 1,000 mg by mouth daily.     calcium carbonate (OS-CAL) 600 MG TABS Take 600 mg by mouth daily.     fluticasone (FLONASE) 50 MCG/ACT nasal spray Place 1 spray into both nostrils daily. 16 g 2   ipratropium (ATROVENT) 0.03 % nasal spray Place 2 sprays into both nostrils every 12 (twelve) hours. 30 mL 12   loratadine (CLARITIN) 10 MG tablet Take 10 mg by mouth daily.     Multiple Vitamin (MULTIVITAMIN) tablet Take 1 tablet by mouth daily.     sildenafil (REVATIO) 20 MG tablet TAKE 2-5 TABLETS BY MOUTH 1 HOUR PIRO TO SEXUAL ACTIVITY 50 tablet 5   No current facility-administered medications for this visit.    PHYSICAL EXAMINATION: ECOG PERFORMANCE STATUS: 0 - Asymptomatic  Vitals:   04/07/22 1519  BP: (!) 160/78  Pulse: (!) 45  Resp: 16  Temp: 98.4 F (36.9 C)  SpO2:  100%    Filed Weights   04/07/22 1519  Weight: 147 lb 4.8 oz (66.8 kg)   Physical Exam Constitutional:      Appearance: Normal appearance.  HENT:     Head: Normocephalic and atraumatic.  Cardiovascular:     Rate and Rhythm: Normal rate and regular rhythm.     Pulses: Normal pulses.     Heart sounds: Normal heart sounds.  Pulmonary:     Effort: Pulmonary effort is normal.     Breath sounds: Normal breath sounds.  Abdominal:     General: Abdomen is flat. There is no distension.     Palpations: Abdomen is soft. There is no mass.     Tenderness: There is no abdominal tenderness.  Musculoskeletal:        General: No swelling or tenderness.     Cervical back: Normal range of motion   and neck supple. No rigidity.  Lymphadenopathy:     Cervical: No cervical adenopathy.  Skin:    General: Skin is warm and dry.  Neurological:     General: No focal deficit present.     Mental Status: He is alert.  Psychiatric:        Mood and Affect: Mood normal.    LABORATORY DATA:  I have reviewed the data as listed Lab Results  Component Value Date   WBC 26.3 (H) 04/07/2022   HGB 13.1 04/07/2022   HCT 40.3 04/07/2022   MCV 88.2 04/07/2022   PLT 140 (L) 04/07/2022     Chemistry      Component Value Date/Time   NA 139 04/07/2022 1505   K 3.6 04/07/2022 1505   CL 106 04/07/2022 1505   CO2 28 04/07/2022 1505   BUN 21 04/07/2022 1505   CREATININE 0.93 04/07/2022 1505   CREATININE 0.97 05/19/2021 0915   CREATININE 1.10 07/22/2020 1358      Component Value Date/Time   CALCIUM 9.5 04/07/2022 1505   ALKPHOS 75 04/07/2022 1505   AST 23 04/07/2022 1505   AST 26 05/19/2021 0915   ALT 15 04/07/2022 1505   ALT 26 05/19/2021 0915   BILITOT 0.5 04/07/2022 1505   BILITOT 0.8 05/19/2021 0915     Flow consistent with CLL, 13 q deletion, favorable prognosis. CBC from today showed white blood cell count of 26,000, differential pending, hemoglobin normal at 13.1 g, mild thrombocytopenia with a  platelet count of 140,000  RADIOGRAPHIC STUDIES: I have personally reviewed the radiological images as listed and agreed with the findings in the report. No results found.  All questions were answered. The patient knows to call the clinic with any problems, questions or concerns. I spent 20 minutes in the care of this patient including H and P, review of records, counseling and coordination of care.     Benay Pike, MD 04/07/2022 3:51 PM

## 2022-04-07 NOTE — Assessment & Plan Note (Signed)
This is a very pleasant 61 year old male patient with past medical history significant for testicular cancer referred to hematology for evaluation and treatment of CLL.  FISH for CLL showed 13 q. deletion consistent with favorable prognosis, IgH V somatic hyper mutation not detected. No concerning review of systems.  He remains clinically asymptomatic.  Physical examination unremarkable, no palpable lymphadenopathy or hepatosplenomegaly.  He does not appear to have any active disease at this time.  CBC showed stable leukocytosis, no evidence of anemia, mild thrombocytopenia.  He can return to clinic for follow-up in 3 to 4 months or sooner as needed.

## 2022-04-30 ENCOUNTER — Other Ambulatory Visit (HOSPITAL_COMMUNITY): Payer: Self-pay

## 2022-06-23 NOTE — Progress Notes (Unsigned)
   I, Peterson Lombard, LAT, ATC acting as a scribe for Lynne Leader, MD.  Subjective:    CC: R knee pain  HPI: Pt is a 61 y/o male c/o R knee pain x /. MOI:? Pt locates pain to  R Knee swelling: Mechanical symptoms: Aggravates: Treatments tried:  Pertinent review of Systems: ***  Relevant historical information: ***   Objective:   There were no vitals filed for this visit. General: Well Developed, well nourished, and in no acute distress.   MSK: ***  Lab and Radiology Results No results found for this or any previous visit (from the past 72 hour(s)). No results found.    Impression and Recommendations:    Assessment and Plan: 61 y.o. male with ***.  PDMP not reviewed this encounter. No orders of the defined types were placed in this encounter.  No orders of the defined types were placed in this encounter.   Discussed warning signs or symptoms. Please see discharge instructions. Patient expresses understanding.   ***

## 2022-06-24 ENCOUNTER — Ambulatory Visit: Payer: Self-pay

## 2022-06-24 ENCOUNTER — Ambulatory Visit (INDEPENDENT_AMBULATORY_CARE_PROVIDER_SITE_OTHER): Payer: 59 | Admitting: Family Medicine

## 2022-06-24 ENCOUNTER — Ambulatory Visit (INDEPENDENT_AMBULATORY_CARE_PROVIDER_SITE_OTHER): Payer: 59

## 2022-06-24 VITALS — BP 142/78 | HR 54 | Ht 71.0 in | Wt 145.2 lb

## 2022-06-24 DIAGNOSIS — M25561 Pain in right knee: Secondary | ICD-10-CM | POA: Diagnosis not present

## 2022-06-24 NOTE — Patient Instructions (Addendum)
Thank you for coming in today.  Please use Voltaren gel (Generic Diclofenac Gel) up to 4x daily for pain as needed.  This is available over-the-counter as both the name brand Voltaren gel and the generic diclofenac gel.   You received an injection today. Seek immediate medical attention if the joint becomes red, extremely painful, or is oozing fluid.   If not getting better, let me know and we will proceed to a MRI.

## 2022-06-25 NOTE — Progress Notes (Signed)
Right knee x-ray looks normal to radiology

## 2022-07-06 ENCOUNTER — Encounter: Payer: Self-pay | Admitting: Pulmonary Disease

## 2022-07-07 NOTE — Telephone Encounter (Signed)
Tried calling the pt and there was no answer- LMTCB.  

## 2022-07-07 NOTE — Telephone Encounter (Signed)
Will forward to Dr. Lamonte Sakai, as Dr. Erin Fulling is unavailable. Please see pt's email. Pt is due for an appointment and has been instructed to call and schedule 6 month ROV. There are no APP appts until next week.

## 2022-07-07 NOTE — Telephone Encounter (Signed)
Agree with getting the office visit  Please call in prednisone taper: Take '40mg'$  daily for 3 days, then '30mg'$  daily for 3 days, then '20mg'$  daily for 3 days, then '10mg'$  daily for 3 days, then stop  Please call in doxycycline 100 mg twice a day for 7 days

## 2022-07-08 ENCOUNTER — Telehealth: Payer: Self-pay | Admitting: Pulmonary Disease

## 2022-07-08 MED ORDER — DOXYCYCLINE HYCLATE 100 MG PO TABS
100.0000 mg | ORAL_TABLET | Freq: Two times a day (BID) | ORAL | 0 refills | Status: DC
Start: 2022-07-08 — End: 2022-07-24

## 2022-07-08 MED ORDER — PREDNISONE 10 MG PO TABS: 10.0000 mg | ORAL_TABLET | Freq: Every day | ORAL | 0 refills | Status: DC

## 2022-07-08 NOTE — Telephone Encounter (Signed)
Patient is returning a call regarding a message he sent for his medication.  Please call patient back to discuss at [phone]

## 2022-07-08 NOTE — Telephone Encounter (Signed)
See other note. Closing encounter.  

## 2022-07-08 NOTE — Telephone Encounter (Signed)
He called back and is going to call and make a follow up another day as he was driving. He will let us know where he would like the medication sent to as he is out of town for a couple of days.

## 2022-07-09 ENCOUNTER — Telehealth: Payer: Self-pay | Admitting: Pulmonary Disease

## 2022-07-10 MED ORDER — PREDNISONE 10 MG PO TABS: ORAL_TABLET | ORAL | 0 refills | Status: AC

## 2022-07-10 NOTE — Telephone Encounter (Signed)
See mychart encounter note dated 07/06/22

## 2022-07-13 ENCOUNTER — Encounter: Payer: Self-pay | Admitting: Family Medicine

## 2022-07-13 ENCOUNTER — Other Ambulatory Visit: Payer: Self-pay

## 2022-07-13 DIAGNOSIS — M25561 Pain in right knee: Secondary | ICD-10-CM

## 2022-07-18 ENCOUNTER — Ambulatory Visit (INDEPENDENT_AMBULATORY_CARE_PROVIDER_SITE_OTHER): Payer: 59

## 2022-07-18 DIAGNOSIS — M25561 Pain in right knee: Secondary | ICD-10-CM | POA: Diagnosis not present

## 2022-07-20 NOTE — Progress Notes (Signed)
Number of the knee shows a medial meniscus tear.  Otherwise the knee looks great.  You may be a good candidate for surgery.  Would you like me to refer you directly to an orthopedic surgeon to discuss her options in full detail?  Otherwise recommend return to clinic with me to discuss the MRI in full detail and talk about treatment plan and options from here.

## 2022-07-23 NOTE — Progress Notes (Signed)
I, Peterson Lombard, LAT, ATC acting as a scribe for Lynne Leader, MD.  Harold Benton is a 61 y.o. male who presents to [place] at [facility] today for f/u R knee pain and MRI review. Pt is an avid runner. Pt was last seen by Dr.Takiah Maiden on 06/24/22 and was given a R knee steroid injection and was advised to use Voltaren gel. Pt sent a MyChart message on 9/11 reporting R knee was "locking up" on him and a MRI was ordered. Today, pt reports right now is R knee is feeling great. Pt has been cont to run on a treadmill.  He has not had any further mechanical symptoms episodes.  Dx imaging:07/18/22 R knee MRI  06/24/22 R knee XR  Pertinent review of systems: No fevers or chills  Relevant historical information: Chronic lymphocytic lymphoma History of testicular cancer.  Exam:  BP 138/82   Ht '5\' 11"'$  (1.803 m)   Wt 144 lb (65.3 kg)   BMI 20.08 kg/m  General: Well Developed, well nourished, and in no acute distress.   MSK: Right knee normal-appearing normal motion.    Lab and Radiology Results  EXAM: MRI OF THE RIGHT KNEE WITHOUT CONTRAST   TECHNIQUE: Multiplanar, multisequence MR imaging of the right was performed. No intravenous contrast was administered.   COMPARISON:  Radiographs dated June 24, 2022   FINDINGS: MENISCI   Medial: Horizontal oblique undersurface tear of the body and posterior horn of the medial meniscus.   Lateral: Intact.   LIGAMENTS   Cruciates: ACL and PCL are intact.   Collaterals: Medial collateral ligament is intact. Lateral collateral ligament complex is intact.   CARTILAGE   Patellofemoral:  No chondral defect.   Medial:  No chondral defect.   Lateral:  No chondral defect.   JOINT: Small joint effusion. Normal Hoffa's fat-pad. No plical thickening.   POPLITEAL FOSSA: Popliteus tendon is intact. No Baker's cyst.   EXTENSOR MECHANISM: Intact quadriceps tendon. Intact patellar tendon. Intact lateral patellar  retinaculum. Intact medial patellar retinaculum. Intact MPFL.   BONES: No aggressive osseous lesion. No fracture or dislocation.   Other: No fluid collection or hematoma. Muscles are normal.   IMPRESSION: 1. Horizontal oblique undersurface tear of the body and posterior horn of the medial meniscus. 2. Small knee joint effusion. 3. Cruciate and collateral ligaments are intact. Quadriceps tendon and patellar tendons are maintained. No evidence of fracture or osteonecrosis.     Electronically Signed   By: Keane Police D.O.   On: 07/19/2022 22:18    I, Lynne Leader, personally (independently) visualized and performed the interpretation of the images attached in this note.      Assessment and Plan: 61 y.o. male with right knee mechanical pain due to a medial meniscus tear.  He has not had any further episodes of mechanical knee pain and is feeling well.  He does not have degenerative changes seen on x-ray or MRI.  At this point it is hard for me to recommend surgery.  Plan for watchful waiting and continued activity is normal.  If his mechanical symptoms should recur I would recommend surgical consultation.  We discussed the MRI report and imaging and plan and he expresses understanding and agreement. Total encounter time 20 minutes including face-to-face time with the patient and, reviewing past medical record, and charting on the date of service.      Discussed warning signs or symptoms. Please see discharge instructions. Patient expresses understanding.   The above  documentation has been reviewed and is accurate and complete Lynne Leader, M.D.

## 2022-07-24 ENCOUNTER — Ambulatory Visit (INDEPENDENT_AMBULATORY_CARE_PROVIDER_SITE_OTHER): Payer: 59 | Admitting: Family Medicine

## 2022-07-24 DIAGNOSIS — S83249A Other tear of medial meniscus, current injury, unspecified knee, initial encounter: Secondary | ICD-10-CM | POA: Insufficient documentation

## 2022-07-24 DIAGNOSIS — S83241A Other tear of medial meniscus, current injury, right knee, initial encounter: Secondary | ICD-10-CM

## 2022-07-24 NOTE — Patient Instructions (Signed)
Thank you for coming in today.   Let me know if you knee pain returns.

## 2022-08-10 ENCOUNTER — Other Ambulatory Visit: Payer: Self-pay | Admitting: *Deleted

## 2022-08-10 ENCOUNTER — Inpatient Hospital Stay (HOSPITAL_BASED_OUTPATIENT_CLINIC_OR_DEPARTMENT_OTHER): Payer: 59 | Admitting: Hematology and Oncology

## 2022-08-10 ENCOUNTER — Inpatient Hospital Stay: Payer: 59 | Attending: Hematology and Oncology

## 2022-08-10 ENCOUNTER — Encounter: Payer: Self-pay | Admitting: Hematology and Oncology

## 2022-08-10 DIAGNOSIS — F1090 Alcohol use, unspecified, uncomplicated: Secondary | ICD-10-CM | POA: Diagnosis not present

## 2022-08-10 DIAGNOSIS — Z8547 Personal history of malignant neoplasm of testis: Secondary | ICD-10-CM | POA: Insufficient documentation

## 2022-08-10 DIAGNOSIS — D72820 Lymphocytosis (symptomatic): Secondary | ICD-10-CM

## 2022-08-10 DIAGNOSIS — C9110 Chronic lymphocytic leukemia of B-cell type not having achieved remission: Secondary | ICD-10-CM | POA: Insufficient documentation

## 2022-08-10 DIAGNOSIS — Z8701 Personal history of pneumonia (recurrent): Secondary | ICD-10-CM | POA: Insufficient documentation

## 2022-08-10 DIAGNOSIS — Z806 Family history of leukemia: Secondary | ICD-10-CM | POA: Diagnosis not present

## 2022-08-10 DIAGNOSIS — Z809 Family history of malignant neoplasm, unspecified: Secondary | ICD-10-CM | POA: Insufficient documentation

## 2022-08-10 LAB — CBC WITH DIFFERENTIAL (CANCER CENTER ONLY)
Abs Immature Granulocytes: 0.04 10*3/uL (ref 0.00–0.07)
Basophils Absolute: 0.1 10*3/uL (ref 0.0–0.1)
Basophils Relative: 0 %
Eosinophils Absolute: 0.1 10*3/uL (ref 0.0–0.5)
Eosinophils Relative: 0 %
HCT: 39.2 % (ref 39.0–52.0)
Hemoglobin: 12.9 g/dL — ABNORMAL LOW (ref 13.0–17.0)
Immature Granulocytes: 0 %
Lymphocytes Relative: 91 %
Lymphs Abs: 35.6 10*3/uL — ABNORMAL HIGH (ref 0.7–4.0)
MCH: 30.2 pg (ref 26.0–34.0)
MCHC: 32.9 g/dL (ref 30.0–36.0)
MCV: 91.8 fL (ref 80.0–100.0)
Monocytes Absolute: 1 10*3/uL (ref 0.1–1.0)
Monocytes Relative: 3 %
Neutro Abs: 2.3 10*3/uL (ref 1.7–7.7)
Neutrophils Relative %: 6 %
Platelet Count: 104 10*3/uL — ABNORMAL LOW (ref 150–400)
RBC: 4.27 MIL/uL (ref 4.22–5.81)
RDW: 15.9 % — ABNORMAL HIGH (ref 11.5–15.5)
Smear Review: NORMAL
WBC Count: 39.1 10*3/uL — ABNORMAL HIGH (ref 4.0–10.5)
nRBC: 0 % (ref 0.0–0.2)

## 2022-08-10 LAB — CMP (CANCER CENTER ONLY)
ALT: 23 U/L (ref 0–44)
AST: 26 U/L (ref 15–41)
Albumin: 4.2 g/dL (ref 3.5–5.0)
Alkaline Phosphatase: 79 U/L (ref 38–126)
Anion gap: 5 (ref 5–15)
BUN: 23 mg/dL (ref 8–23)
CO2: 28 mmol/L (ref 22–32)
Calcium: 8.8 mg/dL — ABNORMAL LOW (ref 8.9–10.3)
Chloride: 107 mmol/L (ref 98–111)
Creatinine: 1.05 mg/dL (ref 0.61–1.24)
GFR, Estimated: >60 mL/min (ref >60)
Glucose, Bld: 105 mg/dL — ABNORMAL HIGH (ref 70–99)
Potassium: 4.1 mmol/L (ref 3.5–5.1)
Sodium: 140 mmol/L (ref 135–145)
Total Bilirubin: 0.6 mg/dL (ref 0.3–1.2)
Total Protein: 6.6 g/dL (ref 6.5–8.1)

## 2022-08-10 LAB — LACTATE DEHYDROGENASE: LDH: 133 U/L (ref 98–192)

## 2022-08-10 NOTE — Assessment & Plan Note (Signed)
This is a very pleasant 61 year old male patient with past medical history significant for testicular cancer referred to hematology for evaluation and treatment of CLL.  FISH for CLL showed 13 q. deletion consistent with favorable prognosis, IgH V somatic hyper mutation not detected. No concerning review of systems.  He remains clinically asymptomatic.  Physical examination unremarkable, no palpable lymphadenopathy or hepatosplenomegaly.  He does not appear to have any active disease at this time.  CBC today shows worsening leukocytosis and lymphocytosis, worsening thrombocytopenia.  He otherwise has no indication for treatment.  We have discussed once again about the indications for CLL treatment.  We will plan to see him back in 8 weeks or sooner as needed with repeat labs.

## 2022-08-10 NOTE — Progress Notes (Signed)
[facility] NOTE  Patient Care Team: Eulas Post, MD as PCP - General  CHIEF COMPLAINTS/PURPOSE OF CONSULTATION:   Lymphocytosis, follow up  ASSESSMENT & PLAN:   CLL (chronic lymphocytic leukemia) ([place]) This is a very pleasant 61 year old male patient with past medical history significant for testicular cancer referred to hematology for evaluation and treatment of CLL.  FISH for CLL showed 13 q. deletion consistent with favorable prognosis, IgH V somatic hyper mutation not detected. No concerning review of systems.  He remains clinically asymptomatic.  Physical examination unremarkable, no palpable lymphadenopathy or hepatosplenomegaly.  He does not appear to have any active disease at this time.  CBC today shows worsening leukocytosis and lymphocytosis, worsening thrombocytopenia.  He otherwise has no indication for treatment.  We have discussed once again about the indications for CLL treatment.  We will plan to see him back in 8 weeks or sooner as needed with repeat labs.    No orders of the defined types were placed in this encounter.   HISTORY OF PRESENTING ILLNESS:   Harold Benton 61 y.o. male is here because of lymphocytosis.  This is a very pleasant 61 year old male patient with past medical history significant for testicular cancer, recent pneumonia in February 2022 with multiple courses of antibiotics referred to hematology for evaluation of lymphocytosis.   Flow confirmed monoclonal B-cell population. FISH suggested mono allelic deletion of 13 q. 14 no evidence of 17 P/ATM or trisomy 12 IGVH somatic hyper mutation was not detected.  Interim History  Harold Benton is here for a follow up.   Since last visit Harold Benton denies any new health complaints.  No fevers, drenching night sweats, loss of appetite or loss of weight.  No change in breathing, bowel habits or urinary habits.  Rest of the pertinent 10 point ROS reviewed and negative.  REVIEW OF  SYSTEMS:   Constitutional: Denies fevers, chills or abnormal night sweats Eyes: Denies blurriness of vision, double vision or watery eyes Ears, nose, mouth, throat, and face: Denies mucositis or sore throat Respiratory: As mentioned above Cardiovascular: Denies palpitation, chest discomfort or lower extremity swelling Gastrointestinal:  Denies nausea, heartburn or change in bowel habits Skin: Denies abnormal skin rashes Lymphatics: Denies new lymphadenopathy or easy bruising Neurological:Denies numbness, tingling or new weaknesses Behavioral/Psych: Mood is stable, no new changes  All other systems were reviewed with the patient and are negative.  MEDICAL HISTORY:  Past Medical History:  Diagnosis Date   Testicular cancer ([place]) 1984    SURGICAL HISTORY: Past Surgical History:  Procedure Laterality Date   EXPLORATORY LAPAROTOMY  1984   tecticle removal  1984    SOCIAL HISTORY: Social History   Socioeconomic History   Marital status: Married    Spouse name: Not on file   Number of children: Not on file   Years of education: Not on file   Highest education level: Bachelor's degree (e.g., BA, AB, BS)  Occupational History   Not on file  Tobacco Use   Smoking status: Never   Smokeless tobacco: Never  Substance and Sexual Activity   Alcohol use: Yes    Alcohol/week: 6.0 standard drinks of alcohol    Types: 6 Cans of beer per week   Drug use: No   Sexual activity: Not on file  Other Topics Concern   Not on file  Social History Narrative   Not on file   Social Determinants of Health   Financial Resource Strain: Low Risk  (  01/12/2022)   Overall Financial Resource Strain (CARDIA)    Difficulty of Paying Living Expenses: Not hard at all  Food Insecurity: No Food Insecurity (01/12/2022)   Hunger Vital Sign    Worried About Running Out of Food in the Last Year: Never true    Ran Out of Food in the Last Year: Never true  Transportation Needs: No Transportation Needs  (01/12/2022)   PRAPARE - Hydrologist (Medical): No    Lack of Transportation (Non-Medical): No  Physical Activity: Sufficiently Active (01/12/2022)   Exercise Vital Sign    Days of Exercise per Week: 5 days    Minutes of Exercise per Session: 40 min  Stress: No Stress Concern Present (01/12/2022)   [place]    Feeling of Stress : Not at all  Social Connections: [place] (01/12/2022)   Social Connection and Isolation Panel [NHANES]    Frequency of Communication with Friends and Family: More than three times a week    Frequency of Social Gatherings with Friends and Family: More than three times a week    Attends Religious Services: More than 4 times per year    Active Member of [employer] or Organizations: Yes    Attends Music therapist: More than 4 times per year    Marital Status: Married  Human resources officer Violence: Not on file    FAMILY HISTORY: Family History  Problem Relation Age of Onset   Cancer Mother        ?type   Cancer Father        CLL   Alcohol abuse Sister    Colon cancer Neg Hx    Stomach cancer Neg Hx     ALLERGIES:  has No Known Allergies.  MEDICATIONS:  Current Outpatient Medications  Medication Sig Dispense Refill   Ascorbic Acid (VITAMIN C) 1000 MG tablet Take 1,000 mg by mouth daily.     calcium carbonate (OS-CAL) 600 MG TABS Take 600 mg by mouth daily.     loratadine (CLARITIN) 10 MG tablet Take 10 mg by mouth daily.     Multiple Vitamin (MULTIVITAMIN) tablet Take 1 tablet by mouth daily.     No current facility-administered medications for this visit.    PHYSICAL EXAMINATION: ECOG PERFORMANCE STATUS: 0 - Asymptomatic  Vitals:   08/10/22 0927  BP: (!) 143/75  Pulse: 60  Resp: 16  Temp: 97.9 F (36.6 C)  SpO2: 100%    Filed Weights   08/10/22 0927  Weight: 146 lb 8 oz (66.5 kg)   Physical Exam Constitutional:       Appearance: Normal appearance.  HENT:     Head: Normocephalic and atraumatic.  Cardiovascular:     Rate and Rhythm: Normal rate and regular rhythm.     Pulses: Normal pulses.     Heart sounds: Normal heart sounds.  Pulmonary:     Effort: Pulmonary effort is normal.     Breath sounds: Normal breath sounds.  Abdominal:     General: Abdomen is flat. There is no distension.     Palpations: Abdomen is soft. There is no mass.     Tenderness: There is no abdominal tenderness.  Musculoskeletal:        General: No swelling or tenderness.     Cervical back: Normal range of motion and neck supple. No rigidity.  Lymphadenopathy:     Cervical: No cervical adenopathy.  Skin:    General:  Skin is warm and dry.  Neurological:     General: No focal deficit present.     Mental Status: He is alert.  Psychiatric:        Mood and Affect: Mood normal.     LABORATORY DATA:  I have reviewed the data as listed Lab Results  Component Value Date   WBC 39.1 (H) 08/10/2022   HGB 12.9 (L) 08/10/2022   HCT 39.2 08/10/2022   MCV 91.8 08/10/2022   PLT 104 (L) 08/10/2022     Chemistry      Component Value Date/Time   NA 140 08/10/2022 0900   K 4.1 08/10/2022 0900   CL 107 08/10/2022 0900   CO2 28 08/10/2022 0900   BUN 23 08/10/2022 0900   CREATININE 1.05 08/10/2022 0900   CREATININE 1.10 07/22/2020 1358      Component Value Date/Time   CALCIUM 8.8 (L) 08/10/2022 0900   ALKPHOS 79 08/10/2022 0900   AST 26 08/10/2022 0900   ALT 23 08/10/2022 0900   BILITOT 0.6 08/10/2022 0900     Flow consistent with CLL, 13 q deletion, favorable prognosis. CBC from today showed white blood cell count of 26,000, differential pending, hemoglobin normal at 13.1 g, mild thrombocytopenia with a platelet count of 140,000  RADIOGRAPHIC STUDIES: I have personally reviewed the radiological images as listed and agreed with the findings in the report. Harold KNEE RIGHT WO CONTRAST  Result Date: 07/19/2022 CLINICAL  DATA:  Right knee pain for 2 months. EXAM: MRI OF THE RIGHT KNEE WITHOUT CONTRAST TECHNIQUE: Multiplanar, multisequence Harold imaging of the right was performed. No intravenous contrast was administered. COMPARISON:  Radiographs dated June 24, 2022 FINDINGS: MENISCI Medial: Horizontal oblique undersurface tear of the body and posterior horn of the medial meniscus. Lateral: Intact. LIGAMENTS Cruciates: ACL and PCL are intact. Collaterals: Medial collateral ligament is intact. Lateral collateral ligament complex is intact. CARTILAGE Patellofemoral:  No chondral defect. Medial:  No chondral defect. Lateral:  No chondral defect. JOINT: Small joint effusion. Normal Hoffa's fat-pad. No plical thickening. POPLITEAL FOSSA: Popliteus tendon is intact. No Baker's cyst. EXTENSOR MECHANISM: Intact quadriceps tendon. Intact patellar tendon. Intact lateral patellar retinaculum. Intact medial patellar retinaculum. Intact MPFL. BONES: No aggressive osseous lesion. No fracture or dislocation. Other: No fluid collection or hematoma. Muscles are normal. IMPRESSION: 1. Horizontal oblique undersurface tear of the body and posterior horn of the medial meniscus. 2. Small knee joint effusion. 3. Cruciate and collateral ligaments are intact. Quadriceps tendon and patellar tendons are maintained. No evidence of fracture or osteonecrosis. Electronically Signed   By: Keane Police D.O.   On: 07/19/2022 22:18    All questions were answered. The patient knows to call the clinic with any problems, questions or concerns. I spent 30 minutes in the care of this patient including H and P, review of records, counseling and coordination of care.     Benay Pike, MD 08/10/2022 12:53 PM

## 2022-08-31 NOTE — Progress Notes (Signed)
Surgery orders requested via Epic inbox. °

## 2022-09-04 ENCOUNTER — Other Ambulatory Visit (HOSPITAL_BASED_OUTPATIENT_CLINIC_OR_DEPARTMENT_OTHER): Payer: Self-pay

## 2022-09-04 MED ORDER — INFLUENZA VAC SPLIT QUAD 0.5 ML IM SUSY
PREFILLED_SYRINGE | INTRAMUSCULAR | 0 refills | Status: DC
  Filled 2022-09-04: qty 0.5, 1d supply, fill #0

## 2022-09-07 ENCOUNTER — Encounter (HOSPITAL_COMMUNITY)
Admission: RE | Admit: 2022-09-07 | Discharge: 2022-09-07 | Disposition: A | Discharge status: Home / Self Care | Payer: 59 | Source: Ambulatory Visit | Attending: Anesthesiology | Admitting: Anesthesiology

## 2022-09-07 ENCOUNTER — Ambulatory Visit: Payer: Self-pay | Admitting: Surgery

## 2022-09-07 DIAGNOSIS — Z01818 Encounter for other preprocedural examination: Secondary | ICD-10-CM

## 2022-09-10 NOTE — Progress Notes (Addendum)
COVID Vaccine received:  _0  No _1  Yes Date of any COVID positive Test in last 90 days:  none  PCP -  Carolann Littler, MD  Cardiologist - none Pulmonology- Freda [name], MD Hematology - Benay Pike, MD  Chest x-ray - 10-20-2021 PFTs-  10-24-21  Epic EKG -   Stress Test -  ECHO -  Cardiac Cath -   PCR screen: _2  Ordered & Completed                      _3   No Order but Needs PROFEND                      _4   N/A for this surgery  Surgery Plan:  _5  Ambulatory                            _6  Outpatient in bed                            _7  Admit  Anesthesia:    _8  General  _9  Spinal                           _10   Choice _11   MAC  Bowel Prep - _12  No  _13   Yes _____________   None per patient  Pacemaker / ICD device _14  No _15  Yes        Device order form faxed _16  No    _17   Yes      Faxed to:  Spinal Cord Stimulator:_18  No _19  Yes      (Remind patient to bring remote DOS) Other Implants:   History of Sleep Apnea? _20  No _21  Yes   CPAP used?- _22  No _23  Yes    Does the patient monitor blood sugar? _24  No _25  Yes  _26  N/A  Blood Thinner / Instructions:  none Aspirin Instructions:  ERAS Protocol Ordered: _27  No  _28  Yes PRE-SURGERY _29  ENSURE  _30  G2  _31  No Drink Ordered  Patient is to be NPO after: 04:30 am  Comments: This patient cancelled his PST appt for 09-07-22 and rescheduled PST for 09-11-22. Gillian Shields, RN already placed orders on 09-01-22.   Per Tamika, we did not draw a CBG on this patient just the CBC from [facility] order set.   Activity level: Patient can climb a flight of stairs without difficulty; _32  No CP  _33  No SOB.  Patient can perform ADLs without assistance.   Anesthesia review: CLL, asthma (PFTs 10-24-21), Hx testicular cancer.  Patient denies shortness of breath, fever, cough and chest pain at PAT appointment.  Patient verbalized understanding and agreement to the Pre-Surgical Instructions that were given to them at this PAT appointment. Patient  was also educated of the need to review these PAT instructions again prior to his surgery.I reviewed the appropriate phone numbers to call if they have any and questions or concerns.

## 2022-09-10 NOTE — Patient Instructions (Addendum)
SURGICAL WAITING ROOM VISITATION Patients having surgery or a procedure may have no more than 2 support people in the waiting area - these visitors may rotate in the visitor waiting room.   Children under the age of 28 must have an adult with them who is not the patient. If the patient needs to stay at the hospital during part of their recovery, the visitor guidelines for inpatient rooms apply.  PRE-OP VISITATION  Pre-op nurse will coordinate an appropriate time for 1 support person to accompany the patient in pre-op.  This support person may not rotate.  This visitor will be contacted when the time is appropriate for the visitor to come back in the pre-op area.  Please refer to the [facility] website for the visitor guidelines for Inpatients (after your surgery is over and you are in a regular room).  You are not required to quarantine at this time prior to your surgery. However, you must do this: Hand Hygiene often Do NOT share personal items Notify your provider if you are in close contact with someone who has COVID or you develop fever 100.4 or greater, new onset of sneezing, cough, sore throat, shortness of breath or body aches.  If you test positive for Covid or have been in contact with anyone that has tested positive in the last 10 days please notify you surgeon.    Your procedure is scheduled on:  Friday   September 18, 2022  Report to [facility] Main Entrance: [place] entrance where the [employer] is available.   Report to admitting at:   05:15  AM  +++++Call this number if you have any questions or problems the morning of surgery [phone]  Do not eat food after Midnight the night prior to your surgery/procedure.  After Midnight you may have the following liquids until 04:30 AM DAY OF SURGERY  Clear Liquid Diet Water Black Coffee (sugar ok, NO MILK/CREAM OR CREAMERS)  Tea (sugar ok, NO MILK/CREAM OR CREAMERS) regular and decaf                              Plain Jell-O  with no fruit (NO RED)                                           Fruit ices (not with fruit pulp, NO RED)                                     Popsicles (NO RED)                                                                  Juice: apple, WHITE grape, WHITE cranberry Sports drinks like Gatorade or Powerade (NO RED)                FOLLOW BOWEL PREP AND ANY ADDITIONAL PRE OP INSTRUCTIONS YOU RECEIVED FROM YOUR SURGEON'S OFFICE!!!   Oral Hygiene is also important to reduce your risk of infection.  Remember - BRUSH YOUR TEETH THE MORNING OF SURGERY WITH YOUR REGULAR TOOTHPASTE  Take ONLY these medicines the morning of surgery with A SIP OF WATER: none,  you may use Albuterol inhaler if needed.                   You may not have any metal on your body including , jewelry, and body piercing  Do not wear  lotions, powders,  cologne, or deodorant  Men may shave face and neck.   Patients discharged on the day of surgery will not be allowed to drive home.  Someone NEEDS to stay with you for the first 24 hours after anesthesia.  Do not bring your home medications to the hospital. The Pharmacy will dispense medications listed on your medication list to you during your admission in the Hospital.  Special Instructions: Bring a copy of your healthcare power of attorney and living will documents the day of surgery, if you wish to have them scanned into your [facility] Records- EPIC  Please read over the following fact sheets you were given: IF YOU HAVE QUESTIONS ABOUT YOUR PRE-OP INSTRUCTIONS, PLEASE CALL [phone]  ([place])   [place] - Preparing for Surgery Before surgery, you can play an important role.  Because skin is not sterile, your skin needs to be as free of germs as possible.  You can reduce the number of germs on your skin by washing with CHG (chlorahexidine gluconate) soap before surgery.  CHG is an antiseptic cleaner which kills germs and bonds with  the skin to continue killing germs even after washing. Please DO NOT use if you have an allergy to CHG or antibacterial soaps.  If your skin becomes reddened/irritated stop using the CHG and inform your nurse when you arrive at Short Stay. Do not shave (including legs and underarms) for at least 48 hours prior to the first CHG shower.  You may shave your face/neck.  Please follow these instructions carefully:  1.  Shower with CHG Soap the night before surgery and the  morning of surgery.  2.  If you choose to wash your hair, wash your hair first as usual with your normal  shampoo.  3.  After you shampoo, rinse your hair and body thoroughly to remove the shampoo.                             4.  Use CHG as you would any other liquid soap.  You can apply chg directly to the skin and wash.  Gently with a scrungie or clean washcloth.  5.  Apply the CHG Soap to your body ONLY FROM THE NECK DOWN.   Do not use on face/ open                           Wound or open sores. Avoid contact with eyes, ears mouth and genitals (private parts).                       Wash face,  Genitals (private parts) with your normal soap.             6.  Wash thoroughly, paying special attention to the area where your  surgery  will be performed.  7.  Thoroughly rinse your body with warm water from the neck down.  8.  DO NOT shower/wash with your  normal soap after using and rinsing off the CHG Soap.            9.  Pat yourself dry with a clean towel.            10.  Wear clean pajamas.            11.  Place clean sheets on your bed the night of your first shower and do not  sleep with pets.  ON THE DAY OF SURGERY : Do not apply any lotions/deodorants the morning of surgery.  Please wear clean clothes to the hospital/surgery center.    FAILURE TO FOLLOW THESE INSTRUCTIONS MAY RESULT IN THE CANCELLATION OF YOUR SURGERY  PATIENT SIGNATURE_________________________________  NURSE  SIGNATURE__________________________________  ________________________________________________________________________

## 2022-09-11 ENCOUNTER — Other Ambulatory Visit: Payer: Self-pay

## 2022-09-11 ENCOUNTER — Encounter (HOSPITAL_COMMUNITY): Payer: Self-pay

## 2022-09-11 ENCOUNTER — Encounter (HOSPITAL_COMMUNITY)
Admission: RE | Admit: 2022-09-11 | Discharge: 2022-09-11 | Disposition: A | Discharge status: Home / Self Care | Payer: 59 | Source: Ambulatory Visit | Attending: Surgery | Admitting: Surgery

## 2022-09-11 DIAGNOSIS — Z01812 Encounter for preprocedural laboratory examination: Secondary | ICD-10-CM | POA: Insufficient documentation

## 2022-09-11 DIAGNOSIS — Z01818 Encounter for other preprocedural examination: Secondary | ICD-10-CM

## 2022-09-11 HISTORY — DX: Pneumonia, unspecified organism: J18.9

## 2022-09-11 LAB — CBC
HCT: 39.4 % (ref 39.0–52.0)
Hemoglobin: 12.6 g/dL — ABNORMAL LOW (ref 13.0–17.0)
MCH: 30.4 pg (ref 26.0–34.0)
MCHC: 32 g/dL (ref 30.0–36.0)
MCV: 95.2 fL (ref 80.0–100.0)
Platelets: 172 10*3/uL (ref 150–400)
RBC: 4.14 MIL/uL — ABNORMAL LOW (ref 4.22–5.81)
RDW: 15.2 % (ref 11.5–15.5)
WBC: 34.2 10*3/uL — ABNORMAL HIGH (ref 4.0–10.5)
nRBC: 0 % (ref 0.0–0.2)

## 2022-09-16 NOTE — H&P (Signed)
REFERRING PHYSICIAN:  Zena Benton*   PROVIDER:  Joya San, MD   MRN: [id number] DOB: 1961-10-21    Subjective    Chief Complaint: New Consultation (Left inguinal hernia)       History of Present Illness: Harold Benton. is a 61 y.o. male who is seen today as an office consultation at the request of Dr. Elease Hashimoto for evaluation of New Consultation (Left inguinal hernia) .     Harold Benton comes in today to discuss a left inguinal hernia that is been bothering him for several weeks.  It may have occurred during a bout with pneumonia that he had last year that lasted about 11 weeks.  His past medical history is remarkable in the about 1984 he had right testicular cancer requiring an orchiectomy and also he had a lower midline laparotomy and nodal dissection.  He was treated successfully for that and then sometime during COVID was found to have chronic lymphocytic leukemia and is followed over the cancer center for that.  It seems that now he has difficulty shaking and cold since he has had that.   I discussed management of this obvious left inguinal hernia.  He is a thin abdomen and it bulges readily but it is easily reducible.  We discussed management with observation versus repair.  Since if he has had a laparotomy and nodal dissection I think that a robotic repair would be compromised by that scar tissue.  Likewise a tap would not be advisable.  Therefore I think the best approach would be an open left inguinal hernia repair with mesh.  I discussed that with him and I gave him a booklet on hernia repair.     Review of Systems: See HPI as well for other ROS.   ROS      Medical History: Past Medical History      Past Medical History:  Diagnosis Date   History of cancer             Patient Active Problem List  Diagnosis   Acute maxillary sinusitis   Acute suppurative otitis media without spontaneous rupture of eardrum   Asthma   Benign neoplasm of skin of  lower limb   CLL (chronic lymphocytic leukemia) (CMS-HCC)   Folliculitis   Hemangioma of skin and subcutaneous tissue   History of testicular cancer   Lentigo   Lymphocytosis   Melanocytic nevi, unspecified   Rosacea   Seborrheic keratosis      Past Surgical History       Past Surgical History:  Procedure Laterality Date   right orchiectomy Right 1984        Allergies  No Known Allergies           Current Outpatient Medications on File Prior to Visit  Medication Sig Dispense Refill   albuterol 90 mcg/actuation inhaler         ascorbic acid, vitamin C, (VITAMIN C) 1000 MG tablet Take by mouth       calcium carbonate 600 mg calcium (1,500 mg) Tab tablet Take by mouth       fluticasone propionate (FLONASE) 50 mcg/actuation nasal spray         loratadine (CLARITIN) 10 mg tablet Take 10 mg by mouth once daily       multivit-min/FA/lycopen/lutein (CENTRUM SILVER MEN ORAL) Take by mouth        No current facility-administered medications on file prior to visit.      Family History  History reviewed. No pertinent family history.      Social History       Tobacco Use  Smoking Status Never  Smokeless Tobacco Never      Social History  Social History        Socioeconomic History   Marital status: Married  Tobacco Use   Smoking status: Never   Smokeless tobacco: Never  Vaping Use   Vaping Use: Never used  Substance and Sexual Activity   Alcohol use: Not Currently   Drug use: Never        Objective:         Vitals:    03/12/22 1040  BP: 126/70  Pulse: 58  SpO2: 98%  Weight: 66.8 kg (147 lb 3.2 oz)  Height: 180.3 cm ('5\' 11"'$ )    Body mass index is 20.53 kg/m.   Physical Exam General: Thin well maintained white male HEENT  : Unremarkable.  No carotid bruits noted Chest: Clear Heart: Sinus rhythm without murmurs or gallops Breast: Not examined Abdomen: Midline incision from above the umbilicus.to the pubis GU left inguinal hernia that bulges and  easily reducible.  Is very visible as well. Rectal not performed Extremities full range of motion Neuro alert and oriented x3.  Motor and sensory function grossly intact.  He has good cognitive function and command of the indications or reasons for surgery         Labs, Imaging and Diagnostic Testing: None to review   Assessment and Plan:  Diagnoses and all orders for this visit:   Left inguinal hernia       After discussion I think he would like to go ahead and get this repaired.  We will set this up under general anesthesia at [facility] as an outpatient.  I have reviewed the procedure in preop with him and his wife.       Delissa Silba Donia Pounds, MD

## 2022-09-17 NOTE — Anesthesia Preprocedure Evaluation (Addendum)
Anesthesia Evaluation  Patient identified by MRN, date of birth, ID band Patient awake    Reviewed: Allergy & Precautions, NPO status , Patient's Chart, lab work & pertinent test results  Airway Mallampati: III  TM Distance: >3 FB Neck ROM: Full    Dental no notable dental hx.    Pulmonary asthma    Pulmonary exam normal        Cardiovascular negative cardio ROS Normal cardiovascular exam     Neuro/Psych negative neurological ROS  negative psych ROS   GI/Hepatic negative GI ROS,,,(+)     substance abuse    Endo/Other  negative endocrine ROS    Renal/GU negative Renal ROS     Musculoskeletal negative musculoskeletal ROS (+)    Abdominal   Peds  Hematology negative hematology ROS (+)   Anesthesia Other Findings LEFT INGUINAL HERNIA  Reproductive/Obstetrics                             Anesthesia Physical Anesthesia Plan  ASA: 2  Anesthesia Plan: General   Post-op Pain Management:    Induction: Intravenous  PONV Risk Score and Plan: 3 and Ondansetron, Dexamethasone, Midazolam and Treatment may vary due to age or medical condition  Airway Management Planned: Oral ETT  Additional Equipment:   Intra-op Plan:   Post-operative Plan: Extubation in OR  Informed Consent: I have reviewed the patients History and Physical, chart, labs and discussed the procedure including the risks, benefits and alternatives for the proposed anesthesia with the patient or authorized representative who has indicated his/her understanding and acceptance.     Dental advisory given  Plan Discussed with: CRNA  Anesthesia Plan Comments:        Anesthesia Quick Evaluation

## 2022-09-18 ENCOUNTER — Ambulatory Visit (HOSPITAL_COMMUNITY): Payer: 59 | Admitting: Physician Assistant

## 2022-09-18 ENCOUNTER — Encounter (HOSPITAL_COMMUNITY)
Admission: RE | Disposition: A | Discharge status: Home / Self Care | Payer: Self-pay | Source: Home / Self Care | Attending: Surgery

## 2022-09-18 ENCOUNTER — Ambulatory Visit (HOSPITAL_COMMUNITY)
Admission: RE | Admit: 2022-09-18 | Discharge: 2022-09-18 | Disposition: A | Discharge status: Home / Self Care | Payer: 59 | Attending: Surgery | Admitting: Surgery

## 2022-09-18 ENCOUNTER — Other Ambulatory Visit: Payer: Self-pay

## 2022-09-18 ENCOUNTER — Ambulatory Visit (HOSPITAL_BASED_OUTPATIENT_CLINIC_OR_DEPARTMENT_OTHER): Payer: 59 | Admitting: Certified Registered Nurse Anesthetist

## 2022-09-18 ENCOUNTER — Encounter (HOSPITAL_COMMUNITY): Payer: Self-pay | Admitting: Surgery

## 2022-09-18 DIAGNOSIS — J45909 Unspecified asthma, uncomplicated: Secondary | ICD-10-CM | POA: Insufficient documentation

## 2022-09-18 DIAGNOSIS — C9110 Chronic lymphocytic leukemia of B-cell type not having achieved remission: Secondary | ICD-10-CM | POA: Insufficient documentation

## 2022-09-18 DIAGNOSIS — Z8547 Personal history of malignant neoplasm of testis: Secondary | ICD-10-CM | POA: Diagnosis not present

## 2022-09-18 DIAGNOSIS — Z9079 Acquired absence of other genital organ(s): Secondary | ICD-10-CM | POA: Insufficient documentation

## 2022-09-18 DIAGNOSIS — K4090 Unilateral inguinal hernia, without obstruction or gangrene, not specified as recurrent: Secondary | ICD-10-CM | POA: Insufficient documentation

## 2022-09-18 DIAGNOSIS — Z01818 Encounter for other preprocedural examination: Secondary | ICD-10-CM

## 2022-09-18 HISTORY — PX: INGUINAL HERNIA REPAIR: SHX194

## 2022-09-18 SURGERY — REPAIR, HERNIA, INGUINAL, ADULT
Anesthesia: General | Laterality: Left

## 2022-09-18 MED ORDER — ORAL CARE MOUTH RINSE: 15.0000 mL | Freq: Once | OROMUCOSAL | Status: AC

## 2022-09-18 MED ORDER — SODIUM CHLORIDE (PF) 0.9 % IJ SOLN
INTRAMUSCULAR | Status: AC
  Filled 2022-09-18: qty 10

## 2022-09-18 MED ORDER — ONDANSETRON HCL 4 MG/2ML IJ SOLN
INTRAMUSCULAR | Status: AC
  Filled 2022-09-18: qty 2

## 2022-09-18 MED ORDER — PROPOFOL 10 MG/ML IV BOLUS
INTRAVENOUS | Status: DC | PRN
  Administered 2022-09-18: 140 mg via INTRAVENOUS

## 2022-09-18 MED ORDER — BUPIVACAINE LIPOSOME 1.3 % IJ SUSP
INTRAMUSCULAR | Status: DC | PRN
  Administered 2022-09-18: 26 mL

## 2022-09-18 MED ORDER — SUGAMMADEX SODIUM 200 MG/2ML IV SOLN
INTRAVENOUS | Status: DC | PRN
  Administered 2022-09-18: 200 mg via INTRAVENOUS

## 2022-09-18 MED ORDER — ROCURONIUM BROMIDE 10 MG/ML (PF) SYRINGE
PREFILLED_SYRINGE | INTRAVENOUS | Status: DC | PRN
  Administered 2022-09-18: 10 mg via INTRAVENOUS
  Administered 2022-09-18: 50 mg via INTRAVENOUS
  Administered 2022-09-18: 10 mg via INTRAVENOUS

## 2022-09-18 MED ORDER — MIDAZOLAM HCL 2 MG/2ML IJ SOLN
INTRAMUSCULAR | Status: AC
  Filled 2022-09-18: qty 2

## 2022-09-18 MED ORDER — ACETAMINOPHEN 500 MG PO TABS
1000.0000 mg | ORAL_TABLET | ORAL | Status: AC
  Administered 2022-09-18: 1000 mg via ORAL
  Filled 2022-09-18: qty 2

## 2022-09-18 MED ORDER — ROCURONIUM BROMIDE 10 MG/ML (PF) SYRINGE
PREFILLED_SYRINGE | INTRAVENOUS | Status: AC
  Filled 2022-09-18: qty 10

## 2022-09-18 MED ORDER — LIDOCAINE 2% (20 MG/ML) 5 ML SYRINGE
INTRAMUSCULAR | Status: DC | PRN
  Administered 2022-09-18: 40 mg via INTRAVENOUS
  Administered 2022-09-18: 60 mg via INTRAVENOUS

## 2022-09-18 MED ORDER — EPHEDRINE SULFATE-NACL 50-0.9 MG/10ML-% IV SOSY
PREFILLED_SYRINGE | INTRAVENOUS | Status: DC | PRN
  Administered 2022-09-18 (×3): 10 mg via INTRAVENOUS
  Administered 2022-09-18: 5 mg via INTRAVENOUS

## 2022-09-18 MED ORDER — SCOPOLAMINE 1 MG/3DAYS TD PT72
1.0000 | MEDICATED_PATCH | TRANSDERMAL | Status: DC
  Administered 2022-09-18: 1.5 mg via TRANSDERMAL
  Filled 2022-09-18: qty 1

## 2022-09-18 MED ORDER — PROPOFOL 10 MG/ML IV BOLUS
INTRAVENOUS | Status: AC
  Filled 2022-09-18: qty 20

## 2022-09-18 MED ORDER — ONDANSETRON HCL 4 MG/2ML IJ SOLN
INTRAMUSCULAR | Status: DC | PRN
  Administered 2022-09-18: 4 mg via INTRAVENOUS

## 2022-09-18 MED ORDER — CEFAZOLIN SODIUM-DEXTROSE 2-4 GM/100ML-% IV SOLN
2.0000 g | INTRAVENOUS | Status: AC
  Administered 2022-09-18: 2 g via INTRAVENOUS
  Filled 2022-09-18: qty 100

## 2022-09-18 MED ORDER — FENTANYL CITRATE (PF) 100 MCG/2ML IJ SOLN
INTRAMUSCULAR | Status: AC
  Filled 2022-09-18: qty 2

## 2022-09-18 MED ORDER — 0.9 % SODIUM CHLORIDE (POUR BTL) OPTIME
TOPICAL | Status: DC | PRN
  Administered 2022-09-18: 1000 mL

## 2022-09-18 MED ORDER — FENTANYL CITRATE (PF) 100 MCG/2ML IJ SOLN
INTRAMUSCULAR | Status: DC | PRN
  Administered 2022-09-18: 100 ug via INTRAVENOUS

## 2022-09-18 MED ORDER — MIDAZOLAM HCL 5 MG/5ML IJ SOLN
INTRAMUSCULAR | Status: DC | PRN
  Administered 2022-09-18: 2 mg via INTRAVENOUS

## 2022-09-18 MED ORDER — CHLORHEXIDINE GLUCONATE CLOTH 2 % EX PADS: 6.0000 | MEDICATED_PAD | Freq: Once | CUTANEOUS | Status: DC

## 2022-09-18 MED ORDER — CHLORHEXIDINE GLUCONATE 0.12 % MT SOLN
15.0000 mL | Freq: Once | OROMUCOSAL | Status: AC
  Administered 2022-09-18: 15 mL via OROMUCOSAL

## 2022-09-18 MED ORDER — BUPIVACAINE LIPOSOME 1.3 % IJ SUSP
INTRAMUSCULAR | Status: AC
  Filled 2022-09-18: qty 20

## 2022-09-18 MED ORDER — DEXAMETHASONE SODIUM PHOSPHATE 10 MG/ML IJ SOLN
INTRAMUSCULAR | Status: DC | PRN
  Administered 2022-09-18: 10 mg via INTRAVENOUS

## 2022-09-18 MED ORDER — LACTATED RINGERS IV SOLN: INTRAVENOUS | Status: DC

## 2022-09-18 MED ORDER — DEXAMETHASONE SODIUM PHOSPHATE 10 MG/ML IJ SOLN
INTRAMUSCULAR | Status: AC
  Filled 2022-09-18: qty 1

## 2022-09-18 MED ORDER — OXYCODONE HCL 5 MG PO TABS: 5.0000 mg | ORAL_TABLET | Freq: Four times a day (QID) | ORAL | 0 refills | Status: DC | PRN

## 2022-09-18 SURGICAL SUPPLY — 31 items
ADH SKN CLS APL DERMABOND .7 (GAUZE/BANDAGES/DRESSINGS) ×1
BLADE SURG 15 STRL LF DISP TIS (BLADE) ×2 IMPLANT
BLADE SURG 15 STRL SS (BLADE) ×1
COVER SURGICAL LIGHT HANDLE (MISCELLANEOUS) ×2 IMPLANT
DERMABOND ADVANCED .7 DNX12 (GAUZE/BANDAGES/DRESSINGS) IMPLANT
DISSECTOR ROUND CHERRY 3/8 STR (MISCELLANEOUS) IMPLANT
DRAIN PENROSE 0.5X18 (DRAIN) ×2 IMPLANT
DRAPE LAPAROTOMY TRNSV 102X78 (DRAPES) ×2 IMPLANT
ELECT REM PT RETURN 15FT ADLT (MISCELLANEOUS) ×2 IMPLANT
GLOVE SURG LX STRL 8.0 MICRO (GLOVE) ×2 IMPLANT
GOWN STRL REUS W/ TWL XL LVL3 (GOWN DISPOSABLE) ×4 IMPLANT
GOWN STRL REUS W/TWL XL LVL3 (GOWN DISPOSABLE) ×2
KIT BASIN OR (CUSTOM PROCEDURE TRAY) ×2 IMPLANT
KIT TURNOVER KIT A (KITS) IMPLANT
MARKER SKIN DUAL TIP RULER LAB (MISCELLANEOUS) ×2 IMPLANT
MESH HERNIA 3X6 (Mesh General) IMPLANT
NEEDLE HYPO 22GX1.5 SAFETY (NEEDLE) ×2 IMPLANT
PACK BASIC VI WITH GOWN DISP (CUSTOM PROCEDURE TRAY) ×2 IMPLANT
PENCIL SMOKE EVACUATOR (MISCELLANEOUS) IMPLANT
SPONGE T-LAP 4X18 ~~LOC~~+RFID (SPONGE) ×2 IMPLANT
STAPLER VISISTAT 35W (STAPLE) IMPLANT
SUT MNCRL AB 4-0 PS2 18 (SUTURE) IMPLANT
SUT PROLENE 2 0 CT2 30 (SUTURE) ×4 IMPLANT
SUT SILK 2 0 SH (SUTURE) IMPLANT
SUT VIC AB 2-0 SH 27 (SUTURE) ×1
SUT VIC AB 2-0 SH 27X BRD (SUTURE) IMPLANT
SUT VIC AB 4-0 SH 18 (SUTURE) IMPLANT
SUT VICRYL 4-0 (SUTURE) IMPLANT
SYR 20ML LL LF (SYRINGE) ×2 IMPLANT
SYR BULB IRRIG 60ML STRL (SYRINGE) ×2 IMPLANT
TOWEL OR NON WOVEN STRL DISP B (DISPOSABLE) ×2 IMPLANT

## 2022-09-18 NOTE — Transfer of Care (Signed)
Immediate Anesthesia Transfer of Care Note  Patient: Dearius Hoffmann  Procedure(s) Performed: OPEN LEFT INDIRECT INGUINAL HERNIA REPAIR WITH MESH (Left)  Patient Location: PACU  Anesthesia Type:General  Level of Consciousness: drowsy  Airway & Oxygen Therapy: Patient Spontanous Breathing and Patient connected to face mask oxygen  Post-op Assessment: Report given to RN and Post -op Vital signs reviewed and stable  Post vital signs: Reviewed and stable  Last Vitals:  Vitals Value Taken Time  BP 140/66 09/18/22 0846  Temp    Pulse 66 09/18/22 0849  Resp 14 09/18/22 0849  SpO2 100 % 09/18/22 0849  Vitals shown include unvalidated device data.  Last Pain:  Vitals:   09/18/22 0603  TempSrc: Oral         Complications: No notable events documented.

## 2022-09-18 NOTE — Anesthesia Postprocedure Evaluation (Signed)
Anesthesia Post Note  Patient: Harold Benton  Procedure(s) Performed: OPEN LEFT INDIRECT INGUINAL HERNIA REPAIR WITH MESH (Left)     Patient location during evaluation: PACU Anesthesia Type: General Level of consciousness: awake Pain management: pain level controlled Vital Signs Assessment: post-procedure vital signs reviewed and stable Respiratory status: spontaneous breathing, nonlabored ventilation, respiratory function stable and patient connected to nasal cannula oxygen Cardiovascular status: blood pressure returned to baseline and stable Postop Assessment: no apparent nausea or vomiting Anesthetic complications: no   No notable events documented.  Last Vitals:  Vitals:   09/18/22 0915 09/18/22 0930  BP: 137/68 (!) 141/72  Pulse: (!) 55 (!) 52  Resp: 11 16  Temp:    SpO2: 100% 98%    Last Pain:  Vitals:   09/18/22 0930  TempSrc:   PainSc: 0-No pain                 Sigfredo Schreier P Win Guajardo

## 2022-09-18 NOTE — Op Note (Signed)
Harold Benton [facility]  1961-08-21   09/18/2022    PCP:  Eulas Post, MD   Surgeon: Kaylyn Lim, MD, FACS  Asst:  Winfield Rast, MD  Anes:  general  Preop Dx: Left inguinal hernia Postop Dx: Left indirect inguinal hernia  Procedure: Open left inguinal hernia with mesh Location Surgery: [facility] 4 Complications: none  EBL:   10 cc  Drains: none  Description of Procedure:  The patient was taken to OR 4 .  After anesthesia was administered and the patient was prepped  with chloroprep  and a timeout was performed.  I oblique incision was made in the left inguinal region and I performed a block with Exparel under over the anterior superior iliac spine and in the wound itself.  We carried this down and it identify the external oblique which we incised along the fibers and retracted proximally and medially and and laterally.  The cord structures were exposed and were mobilized from the floor and retracted with a Penrose drain.  An indirect sac was found proximally and was dissected free from the cord and then a high ligation was performed using a 2-0 silk after twisting the sac and then doing a suture leg at the base and tying around it.  It then retracted in the abdomen.  Repair of the ring and the floor was then done with a piece of Marlex mesh cut to fit sutured along the inguinal ligament with a running 2-0 Prolene and then medially to the internal oblique with a running 2-0 Prolene and then the 2 tails were tucked around the cord structures and sutured himself with a single horizontal mattress suture of 2-0 Prolene.  Again everything was irrigated and was hemostatic.  External bleak was closed with running 2-0 Vicryl.  4-0 Vicryl in the subcutaneous tissue and the skin was closed with running subcuticular 4-0 Monocryl.  The patient tolerated the procedure well and was taken to the PACU in stable condition.     [name] Done, [place], [facility], [place]

## 2022-09-18 NOTE — Interval H&P Note (Signed)
History and Physical Interval Note:  09/18/2022 7:21 AM  Harold Benton  has presented today for surgery, with the diagnosis of LEFT INGUINAL HERNIA.  The various methods of treatment have been discussed with the patient and family. After consideration of risks, benefits and other options for treatment, the patient has consented to  Procedure(s): OPEN LEFT INGUINAL HERNIA REPAIR (Left) as a surgical intervention.  The patient's history has been reviewed, patient examined, no change in status, stable for surgery.  I have reviewed the patient's chart and labs.  Questions were answered to the patient's satisfaction.     [name]

## 2022-09-18 NOTE — Anesthesia Procedure Notes (Signed)
Procedure Name: Intubation Date/Time: 09/18/2022 7:36 AM  Performed by: West Pugh, CRNAPre-anesthesia Checklist: Patient identified, Emergency Drugs available, Suction available, Patient being monitored and Timeout performed Patient Re-evaluated:Patient Re-evaluated prior to induction Oxygen Delivery Method: Circle system utilized Preoxygenation: Pre-oxygenation with 100% oxygen Induction Type: IV induction Ventilation: Mask ventilation without difficulty and Oral airway inserted - appropriate to patient size Laryngoscope Size: Mac and 4 Grade View: Grade II Tube type: Oral Tube size: 7.5 mm Airway Equipment and Method: Stylet Placement Confirmation: ETT inserted through vocal cords under direct vision, positive ETCO2, CO2 detector and breath sounds checked- equal and bilateral Secured at: 22 cm Tube secured with: Tape Dental Injury: Teeth and Oropharynx as per pre-operative assessment

## 2022-09-21 ENCOUNTER — Encounter (HOSPITAL_COMMUNITY): Payer: Self-pay | Admitting: Surgery

## 2022-09-21 ENCOUNTER — Other Ambulatory Visit: Payer: Self-pay

## 2022-09-21 ENCOUNTER — Encounter: Payer: Self-pay | Admitting: Family Medicine

## 2022-09-21 DIAGNOSIS — S83241A Other tear of medial meniscus, current injury, right knee, initial encounter: Secondary | ICD-10-CM

## 2022-10-02 ENCOUNTER — Other Ambulatory Visit: Payer: Self-pay | Admitting: *Deleted

## 2022-10-02 DIAGNOSIS — C9110 Chronic lymphocytic leukemia of B-cell type not having achieved remission: Secondary | ICD-10-CM

## 2022-10-05 ENCOUNTER — Inpatient Hospital Stay: Payer: 59

## 2022-10-05 ENCOUNTER — Encounter: Payer: Self-pay | Admitting: Hematology and Oncology

## 2022-10-05 ENCOUNTER — Inpatient Hospital Stay: Payer: 59 | Attending: Hematology and Oncology | Admitting: Hematology and Oncology

## 2022-10-05 VITALS — BP 132/73 | HR 53 | Temp 97.9°F | Resp 14 | Ht 71.0 in | Wt 144.2 lb

## 2022-10-05 DIAGNOSIS — Z8547 Personal history of malignant neoplasm of testis: Secondary | ICD-10-CM | POA: Diagnosis not present

## 2022-10-05 DIAGNOSIS — C9110 Chronic lymphocytic leukemia of B-cell type not having achieved remission: Secondary | ICD-10-CM | POA: Insufficient documentation

## 2022-10-05 DIAGNOSIS — D696 Thrombocytopenia, unspecified: Secondary | ICD-10-CM | POA: Insufficient documentation

## 2022-10-05 LAB — CBC WITH DIFFERENTIAL (CANCER CENTER ONLY)
Abs Immature Granulocytes: 0.04 10*3/uL (ref 0.00–0.07)
Basophils Absolute: 0.1 10*3/uL (ref 0.0–0.1)
Basophils Relative: 0 %
Eosinophils Absolute: 0.1 10*3/uL (ref 0.0–0.5)
Eosinophils Relative: 0 %
HCT: 39.7 % (ref 39.0–52.0)
Hemoglobin: 13.1 g/dL (ref 13.0–17.0)
Immature Granulocytes: 0 %
Lymphocytes Relative: 89 %
Lymphs Abs: 36.3 10*3/uL — ABNORMAL HIGH (ref 0.7–4.0)
MCH: 30.8 pg (ref 26.0–34.0)
MCHC: 33 g/dL (ref 30.0–36.0)
MCV: 93.4 fL (ref 80.0–100.0)
Monocytes Absolute: 1.1 10*3/uL — ABNORMAL HIGH (ref 0.1–1.0)
Monocytes Relative: 3 %
Neutro Abs: 3.1 10*3/uL (ref 1.7–7.7)
Neutrophils Relative %: 8 %
Platelet Count: 123 10*3/uL — ABNORMAL LOW (ref 150–400)
RBC: 4.25 MIL/uL (ref 4.22–5.81)
RDW: 14 % (ref 11.5–15.5)
WBC Count: 40.7 10*3/uL — ABNORMAL HIGH (ref 4.0–10.5)
nRBC: 0 % (ref 0.0–0.2)

## 2022-10-05 LAB — CMP (CANCER CENTER ONLY)
ALT: 18 U/L (ref 0–44)
AST: 21 U/L (ref 15–41)
Albumin: 4.4 g/dL (ref 3.5–5.0)
Alkaline Phosphatase: 79 U/L (ref 38–126)
Anion gap: 7 (ref 5–15)
BUN: 22 mg/dL (ref 8–23)
CO2: 28 mmol/L (ref 22–32)
Calcium: 9.8 mg/dL (ref 8.9–10.3)
Chloride: 106 mmol/L (ref 98–111)
Creatinine: 1.05 mg/dL (ref 0.61–1.24)
GFR, Estimated: >60 mL/min (ref >60)
Glucose, Bld: 135 mg/dL — ABNORMAL HIGH (ref 70–99)
Potassium: 4.2 mmol/L (ref 3.5–5.1)
Sodium: 141 mmol/L (ref 135–145)
Total Bilirubin: 0.6 mg/dL (ref 0.3–1.2)
Total Protein: 6.7 g/dL (ref 6.5–8.1)

## 2022-10-05 LAB — LACTATE DEHYDROGENASE: LDH: 124 U/L (ref 98–192)

## 2022-10-05 NOTE — Progress Notes (Signed)
[place] NOTE  Patient Care Team: Eulas Post, MD as PCP - General  CHIEF COMPLAINTS/PURPOSE OF CONSULTATION:   Lymphocytosis, follow up  ASSESSMENT & PLAN:   CLL (chronic lymphocytic leukemia) ([place]) This is a very pleasant 61 year old male patient with past medical history significant for testicular cancer referred to hematology for evaluation and treatment of CLL.  FISH for CLL showed 13 q. deletion consistent with favorable prognosis, IgH V somatic hyper mutation not detected. No concerning review of systems.  He remains clinically asymptomatic.  Physical examination unremarkable, no palpable lymphadenopathy or hepatosplenomegaly.  He appears to have slowly trending white blood cell count as well as lymphocyte count.  His lymphocyte count nearly tripled in the past 1 year.  No overt anemia.  Thrombocytopenia remained stable.  Physical examination with no massive lymphadenopathy or hepatosplenomegaly.  At this time I still do not believe he needs immediate treatment for CLL.  Will continue monitoring every 3 months with repeat labs.  He was of course strongly encouraged to contact us with any new questions or concerns.  From CLL standpoint, I do not see any impediment to his upcoming meniscus repair.  No orders of the defined types were placed in this encounter.   HISTORY OF PRESENTING ILLNESS:   Harold Benton 61 y.o. male is here because of lymphocytosis.  This is a very pleasant 61 year old male patient with past medical history significant for testicular cancer, recent pneumonia in February 2022 with multiple courses of antibiotics referred to hematology for evaluation of lymphocytosis.   Flow confirmed monoclonal B-cell population. FISH suggested mono allelic deletion of 13 q. 14 no evidence of 17 P/ATM or trisomy 12 IGVH somatic hyper mutation was not detected.  Interim History  [name] is here for a follow up.  Since last visit, he had his  inguinal hernia repair and this went really well.  No complications.  He feels no symptoms at all.  He is still able to exercise, walks 2 to 3 miles a day, has not gone back to running since inguinal hernia surgery.  He denies any fevers, drenching night sweats, loss of appetite or loss of weight.  No recent infections or hospitalizations otherwise. No change in breathing, bowel habits or urinary habits.  Rest of the pertinent 10 point ROS reviewed and negative.  REVIEW OF SYSTEMS:   Constitutional: Denies fevers, chills or abnormal night sweats Eyes: Denies blurriness of vision, double vision or watery eyes Ears, nose, mouth, throat, and face: Denies mucositis or sore throat Respiratory: As mentioned above Cardiovascular: Denies palpitation, chest discomfort or lower extremity swelling Gastrointestinal:  Denies nausea, heartburn or change in bowel habits Skin: Denies abnormal skin rashes Lymphatics: Denies new lymphadenopathy or easy bruising Neurological:Denies numbness, tingling or new weaknesses Behavioral/Psych: Mood is stable, no new changes  All other systems were reviewed with the patient and are negative.  MEDICAL HISTORY:  Past Medical History:  Diagnosis Date   Pneumonia    Testicular cancer ([place]) 1984    SURGICAL HISTORY: Past Surgical History:  Procedure Laterality Date   EXPLORATORY LAPAROTOMY  11/02/1982   INGUINAL HERNIA REPAIR Left 09/18/2022   Procedure: OPEN LEFT INDIRECT INGUINAL HERNIA REPAIR WITH MESH;  Surgeon: Johnathan Hausen, MD;  Location: [facility];  Service: General;  Laterality: Left;  indirect hernia with mesh   tecticle removal  11/02/1982   WISDOM TOOTH EXTRACTION      SOCIAL HISTORY: Social History   Socioeconomic History   Marital  status: Married    Spouse name: Not on file   Number of children: Not on file   Years of education: Not on file   Highest education level: Bachelor's degree (e.g., BA, AB, BS)  Occupational History   Not on file   Tobacco Use   Smoking status: Never   Smokeless tobacco: Never  Vaping Use   Vaping Use: Never used  Substance and Sexual Activity   Alcohol use: Yes    Alcohol/week: 6.0 standard drinks of alcohol    Types: 6 Cans of beer per week   Drug use: No   Sexual activity: Yes  Other Topics Concern   Not on file  Social History Narrative   Not on file   Social Determinants of Health   Financial Resource Strain: Low Risk  (01/12/2022)   Overall Financial Resource Strain (CARDIA)    Difficulty of Paying Living Expenses: Not hard at all  Food Insecurity: No Food Insecurity (01/12/2022)   Hunger Vital Sign    Worried About Running Out of Food in the Last Year: Never true    Ran Out of Food in the Last Year: Never true  Transportation Needs: No Transportation Needs (01/12/2022)   PRAPARE - Hydrologist (Medical): No    Lack of Transportation (Non-Medical): No  Physical Activity: Sufficiently Active (01/12/2022)   Exercise Vital Sign    Days of Exercise per Week: 5 days    Minutes of Exercise per Session: 40 min  Stress: No Stress Concern Present (01/12/2022)   [place]    Feeling of Stress : Not at all  Social Connections: [place] (01/12/2022)   Social Connection and Isolation Panel [NHANES]    Frequency of Communication with Friends and Family: More than three times a week    Frequency of Social Gatherings with Friends and Family: More than three times a week    Attends Religious Services: More than 4 times per year    Active Member of [employer] or Organizations: Yes    Attends Music therapist: More than 4 times per year    Marital Status: Married  Human resources officer Violence: Not on file    FAMILY HISTORY: Family History  Problem Relation Age of Onset   Cancer Mother        ?type   Cancer Father        CLL   Alcohol abuse Sister    Colon cancer Neg Hx    Stomach  cancer Neg Hx     ALLERGIES:  has No Known Allergies.  MEDICATIONS:  Current Outpatient Medications  Medication Sig Dispense Refill   Ascorbic Acid (VITAMIN C) 1000 MG tablet Take 1,000 mg by mouth daily.     CALCIUM-VITAMIN D PO Take 1 tablet by mouth daily.     influenza vac split quadrivalent PF (FLUARIX) 0.5 ML injection Inject into the muscle. (Patient not taking: Reported on 09/09/2022) 0.5 mL 0   Multiple Vitamin (MULTIVITAMIN) tablet Take 1 tablet by mouth daily.     oxyCODONE (OXY IR/ROXICODONE) 5 MG immediate release tablet Take 1 tablet (5 mg total) by mouth every 6 (six) hours as needed for severe pain. 15 tablet 0   oxymetazoline (AFRIN) 0.05 % nasal spray Place 1 spray into both nostrils at bedtime as needed for congestion.     No current facility-administered medications for this visit.    PHYSICAL EXAMINATION: ECOG PERFORMANCE STATUS: 0 - Asymptomatic  Vitals:   10/05/22 0940  BP: 132/73  Pulse: (!) 53  Resp: 14  Temp: 97.9 F (36.6 C)  SpO2: 100%    Filed Weights   10/05/22 0940  Weight: 144 lb 3.2 oz (65.4 kg)   Physical Exam Constitutional:      Appearance: Normal appearance.  HENT:     Head: Normocephalic and atraumatic.  Cardiovascular:     Rate and Rhythm: Normal rate and regular rhythm.     Pulses: Normal pulses.     Heart sounds: Normal heart sounds.  Pulmonary:     Effort: Pulmonary effort is normal.     Breath sounds: Normal breath sounds.  Abdominal:     General: Abdomen is flat. There is no distension.     Palpations: Abdomen is soft. There is no mass.     Tenderness: There is no abdominal tenderness.  Musculoskeletal:        General: No swelling or tenderness.     Cervical back: Normal range of motion and neck supple. No rigidity.  Lymphadenopathy:     Cervical: No cervical adenopathy.  Skin:    General: Skin is warm and dry.  Neurological:     General: No focal deficit present.     Mental Status: He is alert.  Psychiatric:         Mood and Affect: Mood normal.     LABORATORY DATA:  I have reviewed the data as listed Lab Results  Component Value Date   WBC 40.7 (H) 10/05/2022   HGB 13.1 10/05/2022   HCT 39.7 10/05/2022   MCV 93.4 10/05/2022   PLT 123 (L) 10/05/2022     Chemistry      Component Value Date/Time   NA 141 10/05/2022 0911   K 4.2 10/05/2022 0911   CL 106 10/05/2022 0911   CO2 28 10/05/2022 0911   BUN 22 10/05/2022 0911   CREATININE 1.05 10/05/2022 0911   CREATININE 1.10 07/22/2020 1358      Component Value Date/Time   CALCIUM 9.8 10/05/2022 0911   ALKPHOS 79 10/05/2022 0911   AST 21 10/05/2022 0911   ALT 18 10/05/2022 0911   BILITOT 0.6 10/05/2022 0911     Flow consistent with CLL, 13 q deletion, favorable prognosis. CBC from today showed a white blood cell count of 40,700, normal hemoglobin, platelet count 823,000.  Absolute lymphocytes at 36,300.  RADIOGRAPHIC STUDIES: I have personally reviewed the radiological images as listed and agreed with the findings in the report. No results found.  All questions were answered. The patient knows to call the clinic with any problems, questions or concerns. I spent 20 minutes in the care of this patient including H and P, review of records, counseling and coordination of care.     Benay Pike, MD 10/05/2022 10:57 AM

## 2022-10-05 NOTE — Assessment & Plan Note (Signed)
This is a very pleasant 61 year old male patient with past medical history significant for testicular cancer referred to hematology for evaluation and treatment of CLL.  FISH for CLL showed 13 q. deletion consistent with favorable prognosis, IgH V somatic hyper mutation not detected. No concerning review of systems.  He remains clinically asymptomatic.  Physical examination unremarkable, no palpable lymphadenopathy or hepatosplenomegaly.  He appears to have slowly trending white blood cell count as well as lymphocyte count.  His lymphocyte count nearly tripled in the past 1 year.  No overt anemia.  Thrombocytopenia remained stable.  Physical examination with no massive lymphadenopathy or hepatosplenomegaly.  At this time I still do not believe he needs immediate treatment for CLL.  Will continue monitoring every 3 months with repeat labs.  He was of course strongly encouraged to contact us with any new questions or concerns.  From CLL standpoint, I do not see any impediment to his upcoming meniscus repair.

## 2022-10-07 ENCOUNTER — Encounter: Payer: Self-pay | Admitting: Orthopaedic Surgery

## 2022-10-07 ENCOUNTER — Ambulatory Visit (INDEPENDENT_AMBULATORY_CARE_PROVIDER_SITE_OTHER): Payer: 59 | Admitting: Orthopaedic Surgery

## 2022-10-07 DIAGNOSIS — S83231D Complex tear of medial meniscus, current injury, right knee, subsequent encounter: Secondary | ICD-10-CM

## 2022-10-07 NOTE — Progress Notes (Signed)
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 Lynne Leader to evaluate and treat a known medial meniscal tear of his right knee.  He had a MRI of that right knee in September which showed a horizontal tear to the mid body and posterior horn area of the meniscus on the medial side of his right knee.  Fortunately there is no cartilage deficits at all in all 3 compartments of the knee.  He has been having mechanical symptoms when he pivots with that knee with locking and catching.  Once this does occur he has a hard time flexing the knee fully back.  He has tried activity modification and rest.  Dr. Georgina Snell does work with him extensively as well.  He is even had recent hernia surgery and was able to rest his knee after that but before that surgery was having pain in his knee.  He has not run in a while now.  He is otherwise an active and thin 61 year old gentleman.  This has been going on since July of this year and slowly worsening.  He currently denies any headache, chest pain, shortness of breath, fever, chills, nausea, vomiting.  I was able to review all of his notes within epic in his medical conditions and medications.  Examination of his right knee today shows no effusion.  His range of motion is full.  He does have a positive McMurray's exam to the medial compartment of his knee.  His knee is ligamentously stable.  There is no significant crepitation at all.  Plain films and MRI are reviewed of his right knee on the canopy system.  He does have a complex medial meniscal tear and fortunately no cartilage deficits in the knee at all.  The knee really looks like a young knee other than the medial meniscal tear.  I showed him a knee model and explained in detail what is going on with meniscal tear in his knee.  I have recommended arthroscopic intervention with a partial medial meniscectomy.  I described the rationale behind surgery such as this.  We described the surgery  in detail in terms of what to expect from intraoperative and postoperative course and the risk and benefits of surgery.  I would have him not run for 6 to 12 weeks after that type of surgery but he will work on quad strengthening exercises and he can weight-bear as tolerated as well as get on the treadmill or exercise bike.  All questions and concerns were answered and addressed.  We will work on getting this scheduled in the near future.

## 2022-11-01 IMAGING — DX DG CHEST 2V
2 series · 2 of 2 positions shown · non-contrast
Comparison: 01/30/2021, 02/14/2021

CLINICAL DATA: Short of breath, history of pneumonia

EXAM:
CHEST - 2 VIEW

[chest pa]
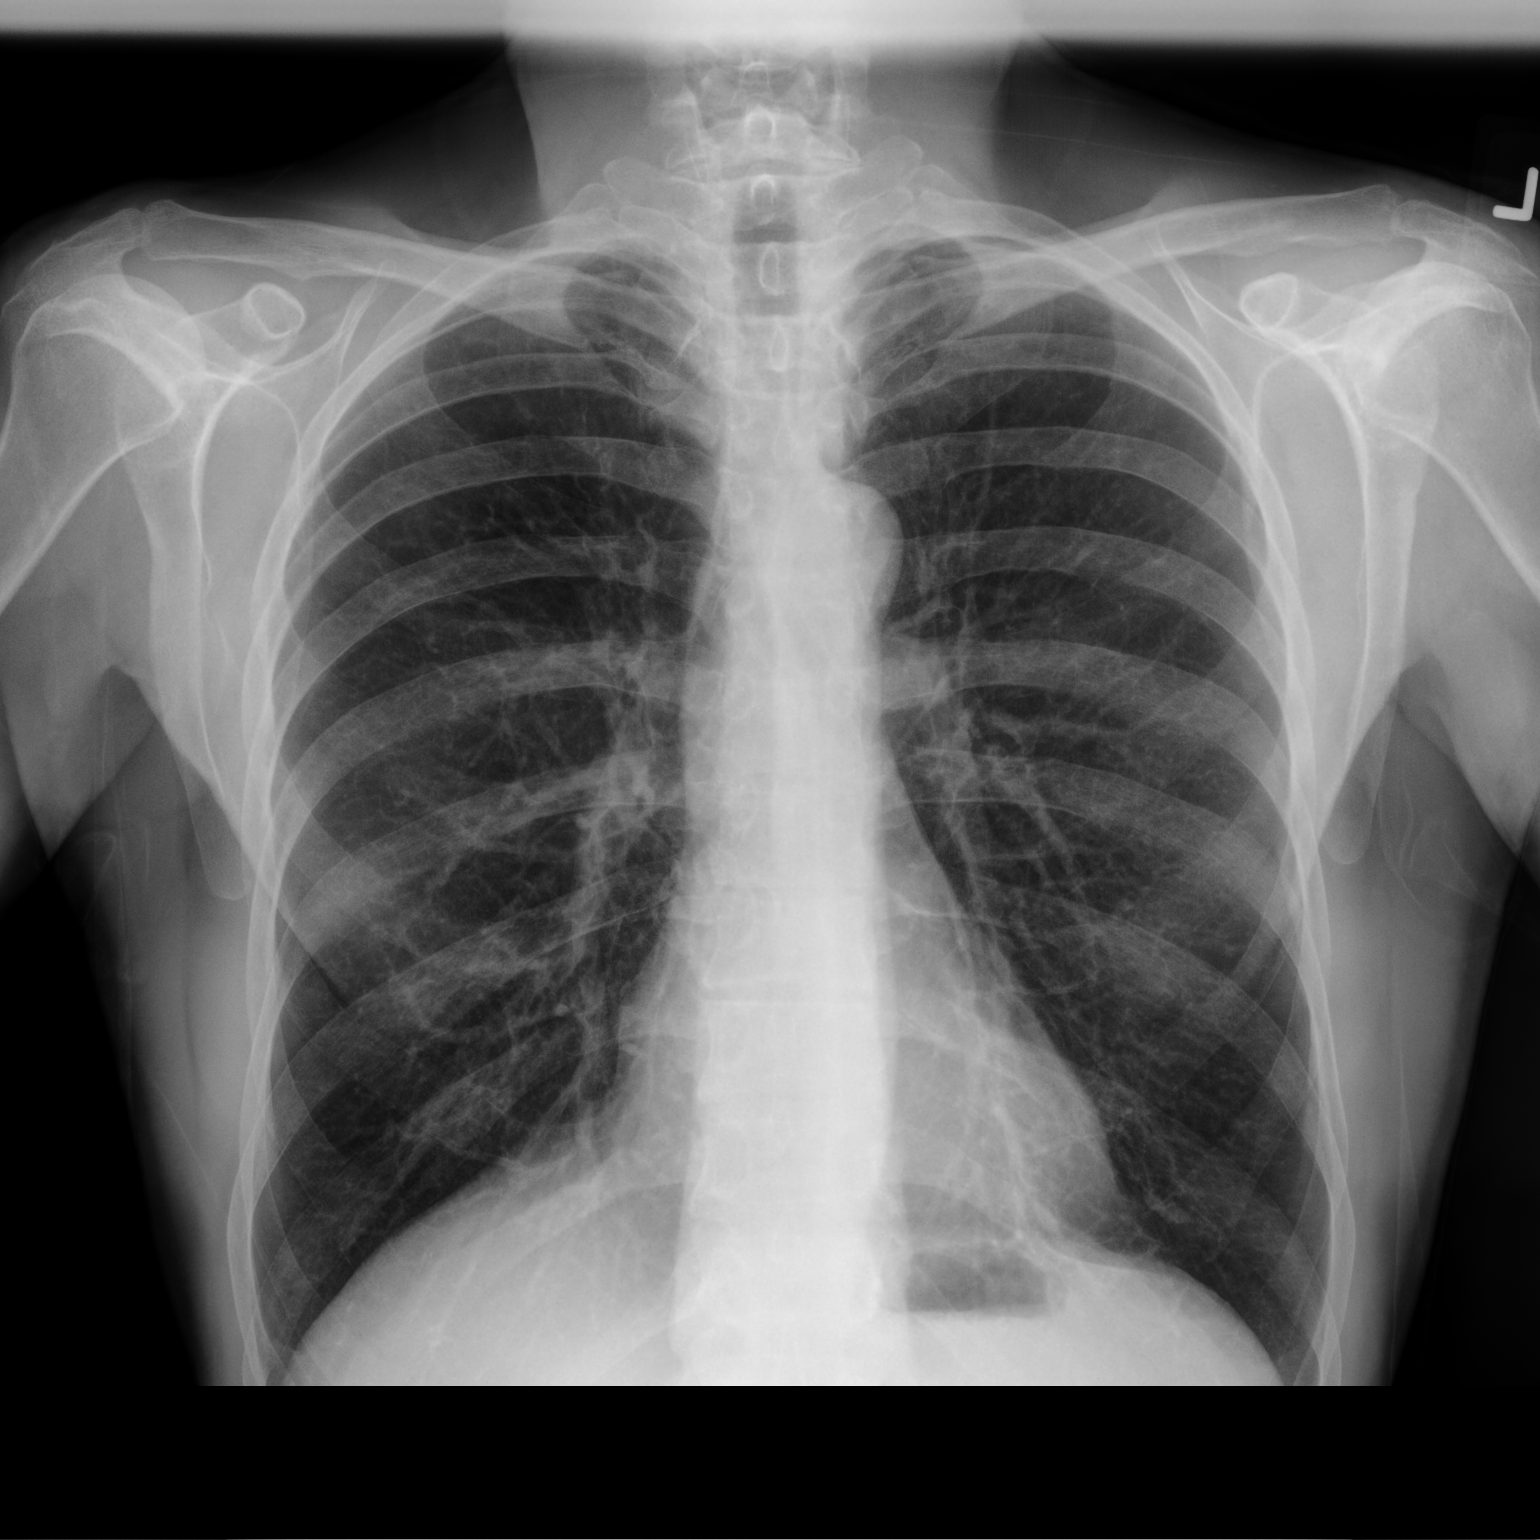

[chest lat]
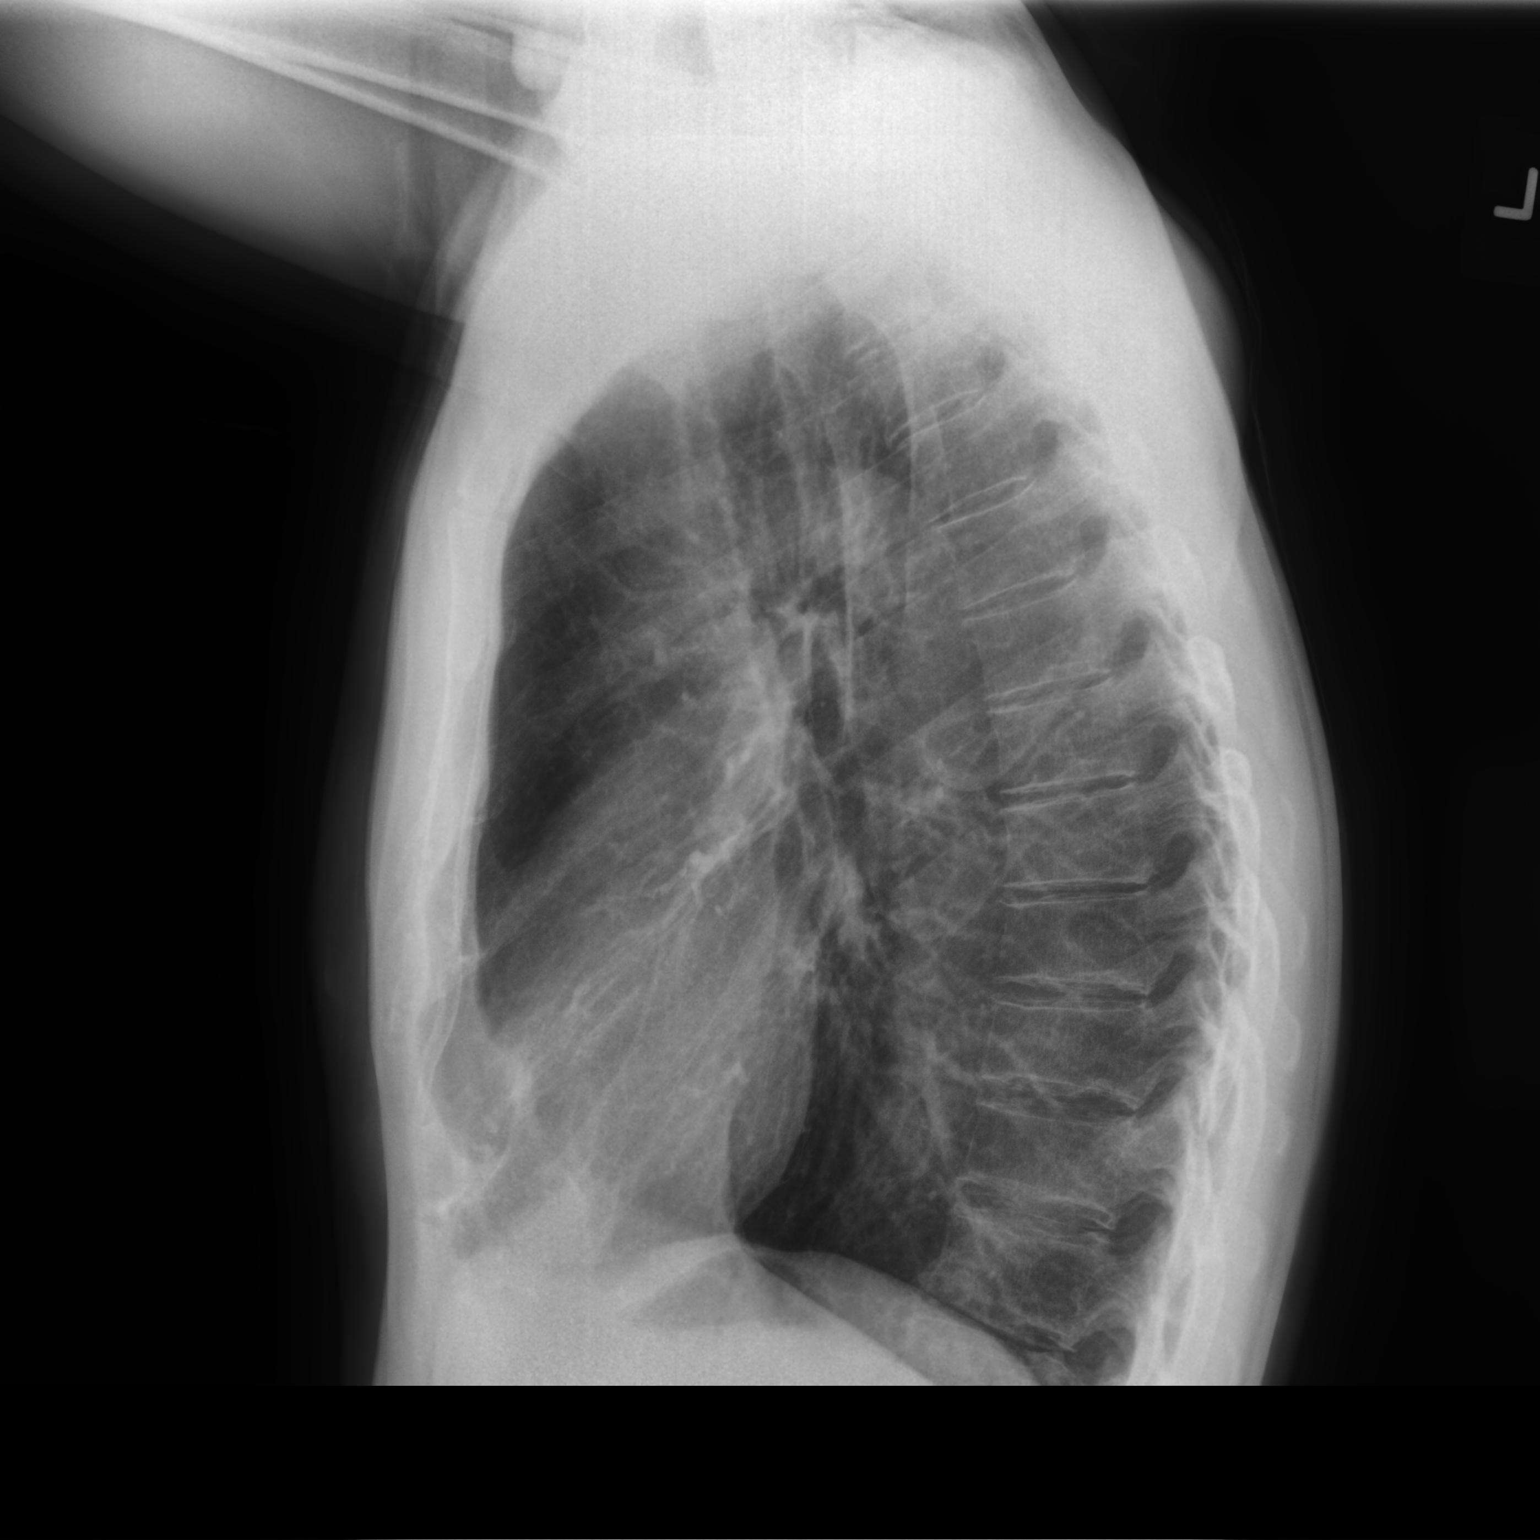

[2 of 2 positions shown; findings below may reference images not displayed]

FINDINGS: Frontal and lateral views of the chest demonstrate an unremarkable
cardiac silhouette. No acute airspace disease, effusion, or
pneumothorax. No acute bony abnormalities.
IMPRESSION: 1. No acute intrathoracic process.

## 2022-11-05 ENCOUNTER — Other Ambulatory Visit: Payer: Self-pay | Admitting: Orthopaedic Surgery

## 2022-11-05 DIAGNOSIS — S83231A Complex tear of medial meniscus, current injury, right knee, initial encounter: Secondary | ICD-10-CM

## 2022-11-05 MED ORDER — HYDROCODONE-ACETAMINOPHEN 5-325 MG PO TABS: 1.0000 | ORAL_TABLET | Freq: Four times a day (QID) | ORAL | 0 refills | Status: DC | PRN

## 2022-11-12 ENCOUNTER — Encounter: Payer: Self-pay | Admitting: Orthopaedic Surgery

## 2022-11-12 ENCOUNTER — Ambulatory Visit (INDEPENDENT_AMBULATORY_CARE_PROVIDER_SITE_OTHER): Payer: 59 | Admitting: Orthopaedic Surgery

## 2022-11-12 DIAGNOSIS — S83231D Complex tear of medial meniscus, current injury, right knee, subsequent encounter: Secondary | ICD-10-CM

## 2022-11-12 DIAGNOSIS — Z9889 Other specified postprocedural states: Secondary | ICD-10-CM

## 2022-11-12 NOTE — Progress Notes (Signed)
The patient is here for his first postoperative visit status post right knee arthroscopy.  He had a significant medial meniscal tear with a flap component.  He is 62 years old.  He is an avid runner and this injury occurred around July of this past year.  Fortunately we found no cartilage irregularities in the knee at all.  The remainder of his knee looks pristine.  On exam today he does have a moderate effusion.  I was able to aspirate about 25 cc of bloody fluid from the knee and then place a steroid injection in his knee.  He tolerated that very well.  He can get back to activities except for running.  I will like to see him back in 4 weeks for repeat exam.  All questions and concerns were answered and addressed.

## 2022-12-10 ENCOUNTER — Ambulatory Visit (INDEPENDENT_AMBULATORY_CARE_PROVIDER_SITE_OTHER): Payer: 59 | Admitting: Orthopaedic Surgery

## 2022-12-10 ENCOUNTER — Encounter: Payer: Self-pay | Admitting: Orthopaedic Surgery

## 2022-12-10 DIAGNOSIS — Z9889 Other specified postprocedural states: Secondary | ICD-10-CM

## 2022-12-10 DIAGNOSIS — S83231D Complex tear of medial meniscus, current injury, right knee, subsequent encounter: Secondary | ICD-10-CM

## 2022-12-10 NOTE — Progress Notes (Signed)
Harold Benton comes in today at 5 weeks status post a right knee arthroscopy with a partial medial meniscectomy.  A large flap tear of his meniscus.  He is an avid runner.  At his first visit we did aspirate fluid from the knee and placed a steroid in his knee.  He said the swelling is gone down he reports good range of motion and strength.  Has been riding his stationary bike a lot.  He is also a Firefighter and plays singles.  We did talk about exercises to try such as leg extensions and leg presses to work on strengthening his quads.  I am fine with him slowly getting back into tennis and slowly getting back into running knowing that he should let his body be the guide in terms of backing off if he does have issues.  He understands he is all some of the meniscus which then changes the architecture of the knee and the resiliency.  On my exam today his exam is almost entirely normal.  There is only minimal swelling and mild pain but excellent range of motion.  The knee is ligamentously stable as well.  Follow-up can be as needed but if there are any issues at all he knows to let us know.

## 2023-01-01 ENCOUNTER — Other Ambulatory Visit: Payer: Self-pay

## 2023-01-01 DIAGNOSIS — C9110 Chronic lymphocytic leukemia of B-cell type not having achieved remission: Secondary | ICD-10-CM

## 2023-01-04 ENCOUNTER — Inpatient Hospital Stay (HOSPITAL_BASED_OUTPATIENT_CLINIC_OR_DEPARTMENT_OTHER): Payer: 59 | Admitting: Hematology and Oncology

## 2023-01-04 ENCOUNTER — Inpatient Hospital Stay: Payer: 59 | Attending: Hematology and Oncology

## 2023-01-04 VITALS — BP 121/60 | HR 66 | Temp 99.1°F | Resp 16 | Ht 71.0 in | Wt 143.7 lb

## 2023-01-04 DIAGNOSIS — Z806 Family history of leukemia: Secondary | ICD-10-CM | POA: Insufficient documentation

## 2023-01-04 DIAGNOSIS — Z8547 Personal history of malignant neoplasm of testis: Secondary | ICD-10-CM | POA: Insufficient documentation

## 2023-01-04 DIAGNOSIS — C9110 Chronic lymphocytic leukemia of B-cell type not having achieved remission: Secondary | ICD-10-CM | POA: Diagnosis not present

## 2023-01-04 DIAGNOSIS — Z809 Family history of malignant neoplasm, unspecified: Secondary | ICD-10-CM | POA: Diagnosis not present

## 2023-01-04 DIAGNOSIS — D72820 Lymphocytosis (symptomatic): Secondary | ICD-10-CM | POA: Diagnosis present

## 2023-01-04 LAB — CMP (CANCER CENTER ONLY)
ALT: 22 U/L (ref 0–44)
AST: 29 U/L (ref 15–41)
Albumin: 4.4 g/dL (ref 3.5–5.0)
Alkaline Phosphatase: 87 U/L (ref 38–126)
Anion gap: 7 (ref 5–15)
BUN: 32 mg/dL — ABNORMAL HIGH (ref 8–23)
CO2: 27 mmol/L (ref 22–32)
Calcium: 9 mg/dL (ref 8.9–10.3)
Chloride: 107 mmol/L (ref 98–111)
Creatinine: 1.09 mg/dL (ref 0.61–1.24)
GFR, Estimated: >60 mL/min (ref >60)
Glucose, Bld: 131 mg/dL — ABNORMAL HIGH (ref 70–99)
Potassium: 4.3 mmol/L (ref 3.5–5.1)
Sodium: 141 mmol/L (ref 135–145)
Total Bilirubin: 0.9 mg/dL (ref 0.3–1.2)
Total Protein: 7 g/dL (ref 6.5–8.1)

## 2023-01-04 LAB — CBC WITH DIFFERENTIAL (CANCER CENTER ONLY)
Abs Immature Granulocytes: 0.1 10*3/uL — ABNORMAL HIGH (ref 0.00–0.07)
Basophils Absolute: 0 10*3/uL (ref 0.0–0.1)
Basophils Relative: 0 %
Eosinophils Absolute: 0 10*3/uL (ref 0.0–0.5)
Eosinophils Relative: 0 %
HCT: 41.7 % (ref 39.0–52.0)
Hemoglobin: 13.9 g/dL (ref 13.0–17.0)
Immature Granulocytes: 0 %
Lymphocytes Relative: 85 %
Lymphs Abs: 42.4 10*3/uL — ABNORMAL HIGH (ref 0.7–4.0)
MCH: 30.8 pg (ref 26.0–34.0)
MCHC: 33.3 g/dL (ref 30.0–36.0)
MCV: 92.3 fL (ref 80.0–100.0)
Monocytes Absolute: 1.2 10*3/uL — ABNORMAL HIGH (ref 0.1–1.0)
Monocytes Relative: 2 %
Neutro Abs: 6.7 10*3/uL (ref 1.7–7.7)
Neutrophils Relative %: 13 %
Platelet Count: 113 10*3/uL — ABNORMAL LOW (ref 150–400)
RBC: 4.52 MIL/uL (ref 4.22–5.81)
RDW: 14.9 % (ref 11.5–15.5)
Smear Review: NORMAL
WBC Count: 50.4 10*3/uL (ref 4.0–10.5)
nRBC: 0 % (ref 0.0–0.2)

## 2023-01-04 LAB — LACTATE DEHYDROGENASE: LDH: 138 U/L (ref 98–192)

## 2023-01-04 NOTE — Progress Notes (Signed)
[facility] NOTE  Patient Care Team: Eulas Post, MD as PCP - General  CHIEF COMPLAINTS/PURPOSE OF CONSULTATION:   Lymphocytosis, follow up  ASSESSMENT & PLAN:   CLL (chronic lymphocytic leukemia) ([place]) This is a very pleasant 62 year old male patient with past medical history significant for testicular cancer referred to hematology for evaluation and treatment of CLL.  FISH for CLL showed 13 q. deletion consistent with favorable prognosis, IgH V somatic hyper mutation not detected. No concerning review of systems.  He remains clinically asymptomatic.  Physical examination unremarkable, no palpable lymphadenopathy or hepatosplenomegaly.  He appears to have slowly trending white blood cell count as well as lymphocyte count.   His CBC continues to show up trending lymphocyte count, no evidence of anemia, stable thrombocytopenia. He reports some ongoing URI and possibly food poisoning, had nausea and vomiting last night. Physical exam unremarkable today, no lymphadenopathy or hepatosplenomegaly. RTC in 2 weeks for a lab. If no sig change, RTC in 3 months with repeat labs. He continues to have no evidence of active disease except worsening leukocytosis or lymphocytosis.  Benay Pike MD     Orders Placed This Encounter  Procedures   CBC with Differential/Platelet    Standing Status:   Standing    Number of Occurrences:   22    Standing Expiration Date:   99991111   Basic Metabolic Panel - [place] Only    Standing Status:   Future    Standing Expiration Date:   01/04/2024    HISTORY OF PRESENTING ILLNESS:   Harold Benton 62 y.o. male is here because of lymphocytosis.  This is a very pleasant 62 year old male patient with past medical history significant for testicular cancer, recent pneumonia in February 2022 with multiple courses of antibiotics referred to hematology for evaluation of lymphocytosis.   Flow confirmed monoclonal B-cell  population. FISH suggested mono allelic deletion of 13 q. 14 no evidence of 17 P/ATM or trisomy 12 IGVH somatic hyper mutation was not detected.  Interim History  [name] is here for a follow up.  Since his last visit here, he continues to feel well.  He denies any fevers, drenching night sweats, loss of appetite or loss of weight.  For the past couple weeks he was fighting a cold and was not willing to try any antibiotics.  He seems to have recovered mostly from it.  Last night he ate some shrimp, at a restaurant and since then has had some bad nausea and vomiting.  He has recovered from this as well.  He otherwise denies any palpable lymphadenopathy.  No bloating, early satiety.  He overall feels really well.  Rest of the pertinent 10 point ROS reviewed and negative  MEDICAL HISTORY:  Past Medical History:  Diagnosis Date   Pneumonia    Testicular cancer ([place]) 1984    SURGICAL HISTORY: Past Surgical History:  Procedure Laterality Date   EXPLORATORY LAPAROTOMY  11/02/1982   INGUINAL HERNIA REPAIR Left 09/18/2022   Procedure: OPEN LEFT INDIRECT INGUINAL HERNIA REPAIR WITH MESH;  Surgeon: Johnathan Hausen, MD;  Location: [facility];  Service: General;  Laterality: Left;  indirect hernia with mesh   tecticle removal  11/02/1982   WISDOM TOOTH EXTRACTION      SOCIAL HISTORY: Social History   Socioeconomic History   Marital status: Married    Spouse name: Not on file   Number of children: Not on file   Years of education: Not on file  Highest education level: Bachelor's degree (e.g., BA, AB, BS)  Occupational History   Not on file  Tobacco Use   Smoking status: Never   Smokeless tobacco: Never  Vaping Use   Vaping Use: Never used  Substance and Sexual Activity   Alcohol use: Yes    Alcohol/week: 6.0 standard drinks of alcohol    Types: 6 Cans of beer per week   Drug use: No   Sexual activity: Yes  Other Topics Concern   Not on file  Social History Narrative   Not on file    Social Determinants of Health   Financial Resource Strain: Low Risk  (01/12/2022)   Overall Financial Resource Strain (CARDIA)    Difficulty of Paying Living Expenses: Not hard at all  Food Insecurity: No Food Insecurity (01/12/2022)   Hunger Vital Sign    Worried About Running Out of Food in the Last Year: Never true    Ran Out of Food in the Last Year: Never true  Transportation Needs: No Transportation Needs (01/12/2022)   PRAPARE - Hydrologist (Medical): No    Lack of Transportation (Non-Medical): No  Physical Activity: Sufficiently Active (01/12/2022)   Exercise Vital Sign    Days of Exercise per Week: 5 days    Minutes of Exercise per Session: 40 min  Stress: No Stress Concern Present (01/12/2022)   [place]    Feeling of Stress : Not at all  Social Connections: [place] (01/12/2022)   Social Connection and Isolation Panel [NHANES]    Frequency of Communication with Friends and Family: More than three times a week    Frequency of Social Gatherings with Friends and Family: More than three times a week    Attends Religious Services: More than 4 times per year    Active Member of [employer] or Organizations: Yes    Attends Music therapist: More than 4 times per year    Marital Status: Married  Human resources officer Violence: Not on file    FAMILY HISTORY: Family History  Problem Relation Age of Onset   Cancer Mother        ?type   Cancer Father        CLL   Alcohol abuse Sister    Colon cancer Neg Hx    Stomach cancer Neg Hx     ALLERGIES:  has No Known Allergies.  MEDICATIONS:  Current Outpatient Medications  Medication Sig Dispense Refill   HYDROcodone-acetaminophen (NORCO/VICODIN) 5-325 MG tablet Take 1-2 tablets by mouth every 6 (six) hours as needed for moderate pain. 30 tablet 0   Ascorbic Acid (VITAMIN C) 1000 MG tablet Take 1,000 mg by mouth daily.      CALCIUM-VITAMIN D PO Take 1 tablet by mouth daily.     influenza vac split quadrivalent PF (FLUARIX) 0.5 ML injection Inject into the muscle. (Patient not taking: Reported on 09/09/2022) 0.5 mL 0   Multiple Vitamin (MULTIVITAMIN) tablet Take 1 tablet by mouth daily.     oxymetazoline (AFRIN) 0.05 % nasal spray Place 1 spray into both nostrils at bedtime as needed for congestion.     No current facility-administered medications for this visit.    PHYSICAL EXAMINATION: ECOG PERFORMANCE STATUS: 0 - Asymptomatic  Vitals:   01/04/23 0949  BP: 121/60  Pulse: 66  Resp: 16  Temp: 99.1 F (37.3 C)  SpO2: 100%    Filed Weights   01/04/23 0949  Weight: 143 lb 11.2 oz (65.2 kg)   Physical Exam Constitutional:      Appearance: Normal appearance.  HENT:     Head: Normocephalic and atraumatic.  Cardiovascular:     Rate and Rhythm: Normal rate and regular rhythm.     Pulses: Normal pulses.     Heart sounds: Normal heart sounds.  Pulmonary:     Effort: Pulmonary effort is normal.     Breath sounds: Normal breath sounds.  Abdominal:     General: Abdomen is flat. There is no distension.     Palpations: Abdomen is soft. There is no mass.     Tenderness: There is no abdominal tenderness.  Musculoskeletal:        General: No swelling or tenderness.     Cervical back: Normal range of motion and neck supple. No rigidity.  Lymphadenopathy:     Cervical: No cervical adenopathy.  Skin:    General: Skin is warm and dry.  Neurological:     General: No focal deficit present.     Mental Status: He is alert.  Psychiatric:        Mood and Affect: Mood normal.     LABORATORY DATA:  I have reviewed the data as listed Lab Results  Component Value Date   WBC 50.4 (HH) 01/04/2023   HGB 13.9 01/04/2023   HCT 41.7 01/04/2023   MCV 92.3 01/04/2023   PLT 113 (L) 01/04/2023     Chemistry      Component Value Date/Time   NA 141 01/04/2023 0916   K 4.3 01/04/2023 0916   CL 107 01/04/2023  0916   CO2 27 01/04/2023 0916   BUN 32 (H) 01/04/2023 0916   CREATININE 1.09 01/04/2023 0916   CREATININE 1.10 07/22/2020 1358      Component Value Date/Time   CALCIUM 9.0 01/04/2023 0916   ALKPHOS 87 01/04/2023 0916   AST 29 01/04/2023 0916   ALT 22 01/04/2023 0916   BILITOT 0.9 01/04/2023 0916     Flow consistent with CLL, 13 q deletion, favorable prognosis. CBC from today showed WBC of 50.4, normal Hb and plt count of 113K. Normal CMP and LDH  RADIOGRAPHIC STUDIES: I have personally reviewed the radiological images as listed and agreed with the findings in the report. No results found.  All questions were answered. The patient knows to call the clinic with any problems, questions or concerns. I spent 30 minutes in the care of this patient including H and P, review of records, counseling and coordination of care.     Benay Pike, MD 01/04/2023 3:46 PM

## 2023-01-04 NOTE — Assessment & Plan Note (Addendum)
This is a very pleasant 62 year old male patient with past medical history significant for testicular cancer referred to hematology for evaluation and treatment of CLL.  FISH for CLL showed 13 q. deletion consistent with favorable prognosis, IgH V somatic hyper mutation not detected. No concerning review of systems.  He remains clinically asymptomatic.  Physical examination unremarkable, no palpable lymphadenopathy or hepatosplenomegaly.  He appears to have slowly trending white blood cell count as well as lymphocyte count.   His CBC continues to show up trending lymphocyte count, no evidence of anemia, stable thrombocytopenia. He reports some ongoing URI and possibly food poisoning, had nausea and vomiting last night. Physical exam unremarkable today, no lymphadenopathy or hepatosplenomegaly. RTC in 2 weeks for a lab. If no sig change, RTC in 3 months with repeat labs. He continues to have no evidence of active disease except worsening leukocytosis or lymphocytosis.  Benay Pike MD

## 2023-01-05 ENCOUNTER — Telehealth: Payer: Self-pay | Admitting: Hematology and Oncology

## 2023-01-05 NOTE — Telephone Encounter (Signed)
Spoke with patient confirming upcoming appointments  

## 2023-01-18 ENCOUNTER — Telehealth: Payer: Self-pay | Admitting: *Deleted

## 2023-01-18 ENCOUNTER — Inpatient Hospital Stay: Payer: 59

## 2023-01-18 DIAGNOSIS — C9110 Chronic lymphocytic leukemia of B-cell type not having achieved remission: Secondary | ICD-10-CM

## 2023-01-18 LAB — CBC WITH DIFFERENTIAL (CANCER CENTER ONLY)
Abs Immature Granulocytes: 0.07 10*3/uL (ref 0.00–0.07)
Basophils Absolute: 0.1 10*3/uL (ref 0.0–0.1)
Basophils Relative: 0 %
Eosinophils Absolute: 0.1 10*3/uL (ref 0.0–0.5)
Eosinophils Relative: 0 %
HCT: 40.1 % (ref 39.0–52.0)
Hemoglobin: 12.8 g/dL — ABNORMAL LOW (ref 13.0–17.0)
Immature Granulocytes: 0 %
Lymphocytes Relative: 83 %
Lymphs Abs: 32 10*3/uL — ABNORMAL HIGH (ref 0.7–4.0)
MCH: 29.4 pg (ref 26.0–34.0)
MCHC: 31.9 g/dL (ref 30.0–36.0)
MCV: 92.2 fL (ref 80.0–100.0)
Monocytes Absolute: 2.8 10*3/uL — ABNORMAL HIGH (ref 0.1–1.0)
Monocytes Relative: 7 %
Neutro Abs: 3.7 10*3/uL (ref 1.7–7.7)
Neutrophils Relative %: 10 %
Platelet Count: 178 10*3/uL (ref 150–400)
RBC: 4.35 MIL/uL (ref 4.22–5.81)
RDW: 14.5 % (ref 11.5–15.5)
Smear Review: NORMAL
WBC Count: 38.7 10*3/uL — ABNORMAL HIGH (ref 4.0–10.5)
nRBC: 0 % (ref 0.0–0.2)

## 2023-01-18 LAB — CMP (CANCER CENTER ONLY)
ALT: 23 U/L (ref 0–44)
AST: 26 U/L (ref 15–41)
Albumin: 4.2 g/dL (ref 3.5–5.0)
Alkaline Phosphatase: 78 U/L (ref 38–126)
Anion gap: 5 (ref 5–15)
BUN: 17 mg/dL (ref 8–23)
CO2: 29 mmol/L (ref 22–32)
Calcium: 9.4 mg/dL (ref 8.9–10.3)
Chloride: 107 mmol/L (ref 98–111)
Creatinine: 1.06 mg/dL (ref 0.61–1.24)
GFR, Estimated: >60 mL/min (ref >60)
Glucose, Bld: 100 mg/dL — ABNORMAL HIGH (ref 70–99)
Potassium: 5.1 mmol/L (ref 3.5–5.1)
Sodium: 141 mmol/L (ref 135–145)
Total Bilirubin: 0.5 mg/dL (ref 0.3–1.2)
Total Protein: 6.8 g/dL (ref 6.5–8.1)

## 2023-01-18 NOTE — Telephone Encounter (Addendum)
-----   Message from Benay Pike, MD sent at 01/18/2023  1:48 PM EDT ----- WBC lower than 2 weeks ago, this is pretty much what it was 3/4 months ago. Reassuring.  This RN called pt and informed him of MD review and comment.  No further needs at this time.

## 2023-04-07 ENCOUNTER — Telehealth: Payer: Self-pay | Admitting: Hematology and Oncology

## 2023-04-07 ENCOUNTER — Inpatient Hospital Stay: Payer: 59

## 2023-04-07 ENCOUNTER — Inpatient Hospital Stay: Payer: 59 | Admitting: Hematology and Oncology

## 2023-04-07 NOTE — Telephone Encounter (Signed)
Called and left patient a vm to get rescheduled

## 2023-04-15 ENCOUNTER — Telehealth: Payer: Self-pay | Admitting: Hematology and Oncology

## 2023-04-15 NOTE — Telephone Encounter (Signed)
Spoke with patient confirming rescheduled upcoming appointment

## 2023-05-21 ENCOUNTER — Inpatient Hospital Stay: Payer: 59 | Attending: Hematology and Oncology

## 2023-05-21 ENCOUNTER — Inpatient Hospital Stay (HOSPITAL_BASED_OUTPATIENT_CLINIC_OR_DEPARTMENT_OTHER): Payer: 59 | Admitting: Hematology and Oncology

## 2023-05-21 VITALS — BP 144/70 | HR 60 | Temp 98.1°F | Resp 16 | Wt 144.9 lb

## 2023-05-21 DIAGNOSIS — Z809 Family history of malignant neoplasm, unspecified: Secondary | ICD-10-CM | POA: Diagnosis not present

## 2023-05-21 DIAGNOSIS — Z8701 Personal history of pneumonia (recurrent): Secondary | ICD-10-CM | POA: Insufficient documentation

## 2023-05-21 DIAGNOSIS — C9110 Chronic lymphocytic leukemia of B-cell type not having achieved remission: Secondary | ICD-10-CM | POA: Insufficient documentation

## 2023-05-21 DIAGNOSIS — Z8547 Personal history of malignant neoplasm of testis: Secondary | ICD-10-CM | POA: Insufficient documentation

## 2023-05-21 DIAGNOSIS — D72820 Lymphocytosis (symptomatic): Secondary | ICD-10-CM | POA: Diagnosis present

## 2023-05-21 DIAGNOSIS — Z806 Family history of leukemia: Secondary | ICD-10-CM | POA: Insufficient documentation

## 2023-05-21 DIAGNOSIS — D696 Thrombocytopenia, unspecified: Secondary | ICD-10-CM | POA: Insufficient documentation

## 2023-05-21 LAB — CBC WITH DIFFERENTIAL (CANCER CENTER ONLY)
Abs Immature Granulocytes: 0.04 10*3/uL (ref 0.00–0.07)
Basophils Absolute: 0.2 10*3/uL — ABNORMAL HIGH (ref 0.0–0.1)
Basophils Relative: 0 %
Eosinophils Absolute: 0.1 10*3/uL (ref 0.0–0.5)
Eosinophils Relative: 0 %
HCT: 38.6 % — ABNORMAL LOW (ref 39.0–52.0)
Hemoglobin: 12.9 g/dL — ABNORMAL LOW (ref 13.0–17.0)
Immature Granulocytes: 0 %
Lymphocytes Relative: 91 %
Lymphs Abs: 39.1 10*3/uL — ABNORMAL HIGH (ref 0.7–4.0)
MCH: 30.6 pg (ref 26.0–34.0)
MCHC: 33.4 g/dL (ref 30.0–36.0)
MCV: 91.7 fL (ref 80.0–100.0)
Monocytes Absolute: 1.4 10*3/uL — ABNORMAL HIGH (ref 0.1–1.0)
Monocytes Relative: 3 %
Neutro Abs: 2.7 10*3/uL (ref 1.7–7.7)
Neutrophils Relative %: 6 %
Platelet Count: 108 10*3/uL — ABNORMAL LOW (ref 150–400)
RBC: 4.21 MIL/uL — ABNORMAL LOW (ref 4.22–5.81)
RDW: 14.8 % (ref 11.5–15.5)
Smear Review: NORMAL
WBC Count: 43.4 10*3/uL — ABNORMAL HIGH (ref 4.0–10.5)
nRBC: 0 % (ref 0.0–0.2)

## 2023-05-21 LAB — CMP (CANCER CENTER ONLY)
ALT: 18 U/L (ref 0–44)
AST: 23 U/L (ref 15–41)
Albumin: 4.3 g/dL (ref 3.5–5.0)
Alkaline Phosphatase: 82 U/L (ref 38–126)
Anion gap: 6 (ref 5–15)
BUN: 23 mg/dL (ref 8–23)
CO2: 28 mmol/L (ref 22–32)
Calcium: 9.7 mg/dL (ref 8.9–10.3)
Chloride: 107 mmol/L (ref 98–111)
Creatinine: 0.99 mg/dL (ref 0.61–1.24)
GFR, Estimated: >60 mL/min (ref >60)
Glucose, Bld: 84 mg/dL (ref 70–99)
Potassium: 3.8 mmol/L (ref 3.5–5.1)
Sodium: 141 mmol/L (ref 135–145)
Total Bilirubin: 0.6 mg/dL (ref 0.3–1.2)
Total Protein: 6.7 g/dL (ref 6.5–8.1)

## 2023-05-23 ENCOUNTER — Encounter: Payer: Self-pay | Admitting: Hematology and Oncology

## 2023-05-23 NOTE — Progress Notes (Signed)
[name] CONSULT NOTE  Patient Care Team: [name], MD as PCP - General  CHIEF COMPLAINTS/PURPOSE OF CONSULTATION:   Lymphocytosis, follow up  ASSESSMENT & PLAN:   CLL (chronic lymphocytic leukemia) ([facility]) This is a very pleasant 62 year old male patient with past medical history significant for testicular cancer referred to hematology for evaluation and treatment of CLL.  FISH for CLL showed 13 q. deletion consistent with favorable prognosis, IgH V somatic hyper mutation not detected. No concerning review of systems.  He remains clinically asymptomatic.   Physical examination unremarkable, no palpable lymphadenopathy or hepatosplenomegaly.  We reviewed his labs which showed leukocytosis, lymphocytosis, ongoing thrombocytopenia. At this time, there is no indication for treatment. He will RTC in 3/4 months with repeat labs or sooner as needed.  Rachel Moulds MD     No orders of the defined types were placed in this encounter.   HISTORY OF PRESENTING ILLNESS:   Harold Benton 62 y.o. male is here because of lymphocytosis.  This is a very pleasant 62 year old male patient with past medical history significant for testicular cancer, recent pneumonia in February 2022 with multiple courses of antibiotics referred to hematology for evaluation of lymphocytosis.   Flow confirmed monoclonal B-cell population. FISH suggested mono allelic deletion of 13 q. 14 no evidence of 17 P/ATM or trisomy 12 IGVH somatic hyper mutation was not detected.  Interim History  [name] is here for a follow up.   Since last visit, he denies no major change in health. He is still running regularly, sometimes feels a bit more fatigued. No recent URI's. He is having a great time with his grand kids, expecting another grand daughter. No B symptoms reported No change in breathing, bowel habits or urinary habits No neurological complaints.  Rest of the pertinent 10 point ROS  reviewed and negative  MEDICAL HISTORY:  Past Medical History:  Diagnosis Date   Pneumonia    Testicular cancer ([facility]) 1984    SURGICAL HISTORY: Past Surgical History:  Procedure Laterality Date   EXPLORATORY LAPAROTOMY  11/02/1982   INGUINAL HERNIA REPAIR Left 09/18/2022   Procedure: OPEN LEFT INDIRECT INGUINAL HERNIA REPAIR WITH MESH;  Surgeon: Luretha Murphy, MD;  Location: [facility];  Service: General;  Laterality: Left;  indirect hernia with mesh   tecticle removal  11/02/1982   WISDOM TOOTH EXTRACTION      SOCIAL HISTORY: Social History   Socioeconomic History   Marital status: Married    Spouse name: Not on file   Number of children: Not on file   Years of education: Not on file   Highest education level: Bachelor's degree (e.g., BA, AB, BS)  Occupational History   Not on file  Tobacco Use   Smoking status: Never   Smokeless tobacco: Never  Vaping Use   Vaping status: Never Used  Substance and Sexual Activity   Alcohol use: Yes    Alcohol/week: 6.0 standard drinks of alcohol    Types: 6 Cans of beer per week   Drug use: No   Sexual activity: Yes  Other Topics Concern   Not on file  Social History Narrative   Not on file   Social Determinants of Health   Financial Resource Strain: Low Risk  (01/12/2022)   Overall Financial Resource Strain (CARDIA)    Difficulty of Paying Living Expenses: Not hard at all  Food Insecurity: No Food Insecurity (01/12/2022)   Hunger Vital Sign    Worried About Running Out of  Food in the Last Year: Never true    Ran Out of Food in the Last Year: Never true  Transportation Needs: No Transportation Needs (01/12/2022)   PRAPARE - Administrator, Civil Service (Medical): No    Lack of Transportation (Non-Medical): No  Physical Activity: Sufficiently Active (01/12/2022)   Exercise Vital Sign    Days of Exercise per Week: 5 days    Minutes of Exercise per Session: 40 min  Stress: No Stress Concern Present (01/12/2022)    [employer] of Occupational Health - Occupational Stress Questionnaire    Feeling of Stress : Not at all  Social Connections: Socially [place] (01/12/2022)   Social Connection and Isolation Panel [NHANES]    Frequency of Communication with Friends and Family: More than three times a week    Frequency of Social Gatherings with Friends and Family: More than three times a week    Attends Religious Services: More than 4 times per year    Active Member of [employer] or Organizations: Yes    Attends Engineer, structural: More than 4 times per year    Marital Status: Married  Catering manager Violence: Not on file    FAMILY HISTORY: Family History  Problem Relation Age of Onset   Cancer Mother        ?type   Cancer Father        CLL   Alcohol abuse Sister    Colon cancer Neg Hx    Stomach cancer Neg Hx     ALLERGIES:  has No Known Allergies.  MEDICATIONS:  Current Outpatient Medications  Medication Sig Dispense Refill   HYDROcodone-acetaminophen (NORCO/VICODIN) 5-325 MG tablet Take 1-2 tablets by mouth every 6 (six) hours as needed for moderate pain. 30 tablet 0   Ascorbic Acid (VITAMIN C) 1000 MG tablet Take 1,000 mg by mouth daily.     CALCIUM-VITAMIN D PO Take 1 tablet by mouth daily.     influenza vac split quadrivalent PF (FLUARIX) 0.5 ML injection Inject into the muscle. (Patient not taking: Reported on 09/09/2022) 0.5 mL 0   Multiple Vitamin (MULTIVITAMIN) tablet Take 1 tablet by mouth daily.     oxymetazoline (AFRIN) 0.05 % nasal spray Place 1 spray into both nostrils at bedtime as needed for congestion.     No current facility-administered medications for this visit.    PHYSICAL EXAMINATION: ECOG PERFORMANCE STATUS: 0 - Asymptomatic  Vitals:   05/21/23 1318  BP: (!) 144/70  Pulse: 60  Resp: 16  Temp: 98.1 F (36.7 C)  SpO2: 100%    Filed Weights   05/21/23 1318  Weight: 144 lb 14.4 oz (65.7 kg)   Physical Exam Constitutional:      Appearance:  Normal appearance.  HENT:     Head: Normocephalic and atraumatic.  Cardiovascular:     Rate and Rhythm: Normal rate and regular rhythm.     Pulses: Normal pulses.     Heart sounds: Normal heart sounds.  Pulmonary:     Effort: Pulmonary effort is normal.     Breath sounds: Normal breath sounds.  Abdominal:     General: Abdomen is flat. There is no distension.     Palpations: Abdomen is soft. There is no mass.     Tenderness: There is no abdominal tenderness.  Musculoskeletal:        General: No swelling or tenderness.     Cervical back: Normal range of motion and neck supple. No rigidity.  Lymphadenopathy:  Cervical: No cervical adenopathy.  Skin:    General: Skin is warm and dry.  Neurological:     General: No focal deficit present.     Mental Status: He is alert.  Psychiatric:        Mood and Affect: Mood normal.     LABORATORY DATA:  I have reviewed the data as listed Lab Results  Component Value Date   WBC 43.4 (H) 05/21/2023   HGB 12.9 (L) 05/21/2023   HCT 38.6 (L) 05/21/2023   MCV 91.7 05/21/2023   PLT 108 (L) 05/21/2023     Chemistry      Component Value Date/Time   NA 141 05/21/2023 1256   K 3.8 05/21/2023 1256   CL 107 05/21/2023 1256   CO2 28 05/21/2023 1256   BUN 23 05/21/2023 1256   CREATININE 0.99 05/21/2023 1256   CREATININE 1.10 07/22/2020 1358      Component Value Date/Time   CALCIUM 9.7 05/21/2023 1256   ALKPHOS 82 05/21/2023 1256   AST 23 05/21/2023 1256   ALT 18 05/21/2023 1256   BILITOT 0.6 05/21/2023 1256     Flow consistent with CLL, 13 q deletion, favorable prognosis. CBC from today showed WBC of 50.4, normal Hb and plt count of 113K. Normal CMP and LDH  RADIOGRAPHIC STUDIES: I have personally reviewed the radiological images as listed and agreed with the findings in the report. No results found.  All questions were answered. The patient knows to call the clinic with any problems, questions or concerns. I spent 30 minutes in  the care of this patient including H and P, review of records, counseling and coordination of care.     Rachel Moulds, MD 05/23/2023 3:54 PM

## 2023-05-23 NOTE — Assessment & Plan Note (Signed)
This is a very pleasant 62 year old male patient with past medical history significant for testicular cancer referred to hematology for evaluation and treatment of CLL.  FISH for CLL showed 13 q. deletion consistent with favorable prognosis, IgH V somatic hyper mutation not detected. No concerning review of systems.  He remains clinically asymptomatic.   Physical examination unremarkable, no palpable lymphadenopathy or hepatosplenomegaly.  We reviewed his labs which showed leukocytosis, lymphocytosis, ongoing thrombocytopenia. At this time, there is no indication for treatment. He will RTC in 3/4 months with repeat labs or sooner as needed.  Rachel Moulds MD

## 2023-07-08 ENCOUNTER — Telehealth: Payer: Self-pay | Admitting: Pulmonary Disease

## 2023-07-08 DIAGNOSIS — J4520 Mild intermittent asthma, uncomplicated: Secondary | ICD-10-CM

## 2023-07-08 NOTE — Telephone Encounter (Signed)
Pt had a recurrence of chest congestion again and wants antibiotics [facility] [place]

## 2023-07-08 NOTE — Telephone Encounter (Signed)
Pt requesting prednisone b/c cough and chest congestion has returned. Please advise. I did leave msg that he probably needed to be seen.  Last ov 01/26/2022  Instructions   Return in about 6 months (around 07/29/2022). Your bouts of cough, chest congestion and shortness of breath are related to reactive airways or COPD.    Start Breo ellipta sample 1 puff daily - rinse mouth out after each use

## 2023-07-09 ENCOUNTER — Ambulatory Visit (INDEPENDENT_AMBULATORY_CARE_PROVIDER_SITE_OTHER): Payer: 59 | Admitting: Pulmonary Disease

## 2023-07-09 ENCOUNTER — Encounter: Payer: Self-pay | Admitting: Pulmonary Disease

## 2023-07-09 VITALS — BP 112/58 | HR 60 | Temp 97.4°F | Ht 70.0 in | Wt 146.0 lb

## 2023-07-09 DIAGNOSIS — J449 Chronic obstructive pulmonary disease, unspecified: Secondary | ICD-10-CM

## 2023-07-09 MED ORDER — AZITHROMYCIN 250 MG PO TABS
ORAL_TABLET | ORAL | 0 refills | Status: DC
Start: 2023-07-09 — End: 2023-12-27

## 2023-07-09 MED ORDER — PREDNISONE 10 MG PO TABS: 20.0000 mg | ORAL_TABLET | Freq: Every day | ORAL | 0 refills | Status: AC

## 2023-07-09 NOTE — Patient Instructions (Addendum)
Start Zpak anitbiotic   Start 20mg  prednisone daily for 5 days  Please message [place] on myChart if your symptoms linger  Follow up in 6 months

## 2023-07-09 NOTE — Progress Notes (Signed)
Synopsis: Referred in May 2022 for cough by [name], MD  Subjective:   PATIENT ID: Harold Benton GENDER: male DOB: 1961-10-21, MRN: [id number]  HPI  Chief Complaint  Patient presents with   Follow-up    Chest congestion    [name] is a 62 year old male, never smoker with history of testicular cancer who returns to pulmonary clinic for reactive airways disease.   Started with sinus congestion, sore throat and muscle aches. Coughing up dark mucous. Intermittent wheezing. Denies shortness of breath. He has not had an episode like this since March of 2023. He has not been using any inhalers.   OV 01/26/22 PFTs 10/24/21 showed mild obstructive defect with significant bronchodilator response. Breo was prescribed but it was $300 and he has not started inhaler therapy.   He is experiencing increased sinus and chest congestion. He denies sinus pressure or pain. He is having some increased dyspnea. Denies wheezing. He was prescribed augmentin 3/17 but did not start this treatment yet. He is using afrin nasal spray at bedtime. He is not using any other nasal sprays at this time. He is using claritin and netty pot daily.   OV 10/20/21 He was referred to hematology for leukocytosis at last visit and diagnosed with CLL. He does not require active treatment at this time and they are monitoring his blood counts regularly.   He was treated with augmentin 11/25 after experiencing sinus and chest congestion. He had covid 19 infection in 04/2021.   There was concern for possible organizing pneumonia based on his CT Chest findings from 01/2021 and steroid taper was completed over a 4 week period.  OV 03/25/21 He reports having a cough since February 2022 where he was diagnosed with left lower lobe pneumonia and treated with augmentin and azithromycin. He initially had improvement clinically and radiographicly on repeat chest radiogrph on 3/11 and 3/31. The cough persisted but worsened again in  April where he was treated with a course of levaquin. A CT Chest was done on 02/14/21 which showed airspace disease of the inferior right middle lobe, lingula, right lower lobe and most prominantly in the left lower lobe. There is bronchial wall thickening in the lower lobes with mucous plugging in the bilateral lower lobe airways. Also noted is mildly prominent bilateral axially lymph nodes.  He continues to cough despite the course of levaquin. He is coughing up clear to yellowish sputum. He does have intermittent wheezing.  He also reports right sided neck discomfort and stiffness with limited range of motion of his neck. He also complains of muscle cramping in his calfs and right knee soreness.   His paternal gradparents had emphysema and his father has history of CLL.   Past Medical History:  Diagnosis Date   Pneumonia    Testicular cancer (HCC) 1984     Family History  Problem Relation Age of Onset   Cancer Mother        ?type   Cancer Father        CLL   Alcohol abuse Sister    Colon cancer Neg Hx    Stomach cancer Neg Hx      Social History   Socioeconomic History   Marital status: Married    Spouse name: Not on file   Number of children: Not on file   Years of education: Not on file   Highest education level: Bachelor's degree (e.g., BA, AB, BS)  Occupational History   Not on file  Tobacco Use   Smoking status: Never   Smokeless tobacco: Never  Vaping Use   Vaping status: Never Used  Substance and Sexual Activity   Alcohol use: Yes    Alcohol/week: 6.0 standard drinks of alcohol    Types: 6 Cans of beer per week   Drug use: No   Sexual activity: Yes  Other Topics Concern   Not on file  Social History Narrative   Not on file   Social Determinants of Health   Financial Resource Strain: Low Risk  (01/12/2022)   Overall Financial Resource Strain (CARDIA)    Difficulty of Paying Living Expenses: Not hard at all  Food Insecurity: No Food Insecurity (01/12/2022)    Hunger Vital Sign    Worried About Running Out of Food in the Last Year: Never true    Ran Out of Food in the Last Year: Never true  Transportation Needs: No Transportation Needs (01/12/2022)   PRAPARE - Administrator, Civil Service (Medical): No    Lack of Transportation (Non-Medical): No  Physical Activity: Sufficiently Active (01/12/2022)   Exercise Vital Sign    Days of Exercise per Week: 5 days    Minutes of Exercise per Session: 40 min  Stress: No Stress Concern Present (01/12/2022)   [employer] of Occupational Health - Occupational Stress Questionnaire    Feeling of Stress : Not at all  Social Connections: Socially Integrated (01/12/2022)   Social Connection and Isolation Panel [NHANES]    Frequency of Communication with Friends and Family: More than three times a week    Frequency of Social Gatherings with Friends and Family: More than three times a week    Attends Religious Services: More than 4 times per year    Active Member of [employer] or Organizations: Yes    Attends Engineer, structural: More than 4 times per year    Marital Status: Married  Catering manager Violence: Not on file     No Known Allergies   Outpatient Medications Prior to Visit  Medication Sig Dispense Refill   Ascorbic Acid (VITAMIN C) 1000 MG tablet Take 1,000 mg by mouth daily.     CALCIUM-VITAMIN D PO Take 1 tablet by mouth daily.     Multiple Vitamin (MULTIVITAMIN) tablet Take 1 tablet by mouth daily.     HYDROcodone-acetaminophen (NORCO/VICODIN) 5-325 MG tablet Take 1-2 tablets by mouth every 6 (six) hours as needed for moderate pain. 30 tablet 0   influenza vac split quadrivalent PF (FLUARIX) 0.5 ML injection Inject into the muscle. (Patient not taking: Reported on 09/09/2022) 0.5 mL 0   oxymetazoline (AFRIN) 0.05 % nasal spray Place 1 spray into both nostrils at bedtime as needed for congestion.     No facility-administered medications prior to visit.    Review of  Systems  Constitutional:  Negative for chills, fever, malaise/fatigue and weight loss.  HENT:  Positive for congestion. Negative for sinus pain and sore throat.   Eyes: Negative.   Respiratory:  Positive for cough and sputum production. Negative for hemoptysis, shortness of breath and wheezing.   Cardiovascular:  Negative for chest pain, palpitations, orthopnea, claudication and leg swelling.  Gastrointestinal:  Negative for abdominal pain, heartburn, nausea and vomiting.  Genitourinary: Negative.   Musculoskeletal:  Negative for myalgias.  Skin:  Negative for rash.  Neurological:  Negative for weakness.  Endo/Heme/Allergies: Negative.   Psychiatric/Behavioral: Negative.      Objective:   Vitals:   07/09/23 1305  BP: Marland Kitchen)  112/58  Pulse: 60  Temp: (!) 97.4 F (36.3 C)  TempSrc: Temporal  SpO2: 97%  Weight: 146 lb (66.2 kg)  Height: 5\' 10"  (1.778 m)    Physical Exam Constitutional:      General: He is not in acute distress.    Appearance: Normal appearance.  HENT:     Head: Normocephalic and atraumatic.  Eyes:     Conjunctiva/sclera: Conjunctivae normal.  Cardiovascular:     Rate and Rhythm: Normal rate and regular rhythm.     Pulses: Normal pulses.     Heart sounds: Normal heart sounds. No murmur heard. Pulmonary:     Effort: Pulmonary effort is normal.     Breath sounds: No wheezing or rhonchi.  Musculoskeletal:     Right lower leg: No edema.     Left lower leg: No edema.  Skin:    General: Skin is warm and dry.  Neurological:     General: No focal deficit present.     Mental Status: He is alert.    CBC    Component Value Date/Time   WBC 43.4 (H) 05/21/2023 1256   WBC 34.2 (H) 09/11/2022 1330   RBC 4.21 (L) 05/21/2023 1256   HGB 12.9 (L) 05/21/2023 1256   HCT 38.6 (L) 05/21/2023 1256   PLT 108 (L) 05/21/2023 1256   MCV 91.7 05/21/2023 1256   MCH 30.6 05/21/2023 1256   MCHC 33.4 05/21/2023 1256   RDW 14.8 05/21/2023 1256   LYMPHSABS 39.1 (H) 05/21/2023  1256   MONOABS 1.4 (H) 05/21/2023 1256   EOSABS 0.1 05/21/2023 1256   BASOSABS 0.2 (H) 05/21/2023 1256      Latest Ref Rng & Units 05/21/2023   12:56 PM 01/18/2023    8:25 AM 01/04/2023    9:16 AM  BMP  Glucose 70 - 99 mg/dL 84  098  119   BUN 8 - 23 mg/dL 23  17  32   Creatinine 0.61 - 1.24 mg/dL 1.47  8.29  5.62   Sodium 135 - 145 mmol/L 141  141  141   Potassium 3.5 - 5.1 mmol/L 3.8  5.1  4.3   Chloride 98 - 111 mmol/L 107  107  107   CO2 22 - 32 mmol/L 28  29  27    Calcium 8.9 - 10.3 mg/dL 9.7  9.4  9.0    Chest imaging: CT Chest wo contrast 02/14/21 Airspace disease in the lower lungs bilaterally as described above, most notable in the left lower lobe where there are areas of consolidation. Findings are concerning for pneumonia.   Peribronchial thickening and mucous plugging in the lower lobe Airways.  CXR 12/23/20 Normal cardiac silhouette. Segmental airspace consolidation in the LEFT lower lobe. LEFT upper lobe is clear. RIGHT lung is clear. No acute osseous abnormality.  PFT:    Latest Ref Rng & Units 10/24/2021   10:00 AM  PFT Results  FVC-Pre L 4.12   FVC-Predicted Pre % 83   FVC-Post L 4.41   FVC-Predicted Post % 89   Pre FEV1/FVC % % 56   Post FEV1/FCV % % 65   FEV1-Pre L 2.31   FEV1-Predicted Pre % 61   FEV1-Post L 2.87   DLCO uncorrected ml/min/mmHg 25.03   DLCO UNC% % 87   DLCO corrected ml/min/mmHg 25.03   DLCO COR %Predicted % 87   DLVA Predicted % 84   TLC L 6.81   TLC % Predicted % 94   RV % Predicted % 103  Labs: 4/12: ANA negative, RA 15    Assessment & Plan:   Chronic obstructive pulmonary disease, unspecified COPD type (HCC) - Plan: predniSONE (DELTASONE) 10 MG tablet, azithromycin (ZITHROMAX) 250 MG tablet  Discussion: Harold Benton is a 62 year old male, never smoker with history of testicular cancer and CLL who returns to pulmonary clinic for COPD.   He has been doing well without maintenance inhaler therapy since last visit. He  appears to have caught a viral infection recently which has irritated his lungs with increase in cough and mucous production.   He is to start 20mg  prednisone daily for 5 days and Zpak antibitic. We will consider inhaler course if his symptoms continue to linger.   Follow up in 6 months.  Melody Comas, MD [facility] Office: [phone]    Current Outpatient Medications:    Ascorbic Acid (VITAMIN C) 1000 MG tablet, Take 1,000 mg by mouth daily., Disp: , Rfl:    azithromycin (ZITHROMAX) 250 MG tablet, Take as directed, Disp: 6 tablet, Rfl: 0   CALCIUM-VITAMIN D PO, Take 1 tablet by mouth daily., Disp: , Rfl:    Multiple Vitamin (MULTIVITAMIN) tablet, Take 1 tablet by mouth daily., Disp: , Rfl:    predniSONE (DELTASONE) 10 MG tablet, Take 2 tablets (20 mg total) by mouth daily with breakfast for 5 days., Disp: 10 tablet, Rfl: 0

## 2023-07-20 MED ORDER — PREDNISONE 10 MG PO TABS
ORAL_TABLET | ORAL | 0 refills | Status: AC
Start: 2023-07-20 — End: 2023-07-31

## 2023-07-20 NOTE — Telephone Encounter (Signed)
Steroid taper sent to pharmacy

## 2023-07-23 NOTE — Telephone Encounter (Signed)
Attempted to call pt but unable to reach. Left pt a detailed message letting him know that Rx was sent to pharmacy.

## 2023-08-06 ENCOUNTER — Other Ambulatory Visit: Payer: Self-pay | Admitting: Family Medicine

## 2023-08-06 DIAGNOSIS — Z1211 Encounter for screening for malignant neoplasm of colon: Secondary | ICD-10-CM

## 2023-08-06 DIAGNOSIS — Z1212 Encounter for screening for malignant neoplasm of rectum: Secondary | ICD-10-CM

## 2023-08-09 ENCOUNTER — Other Ambulatory Visit: Payer: Self-pay

## 2023-08-30 ENCOUNTER — Other Ambulatory Visit (HOSPITAL_COMMUNITY): Payer: Self-pay

## 2023-09-07 LAB — COLOGUARD: COLOGUARD: NEGATIVE

## 2023-09-20 ENCOUNTER — Inpatient Hospital Stay (HOSPITAL_BASED_OUTPATIENT_CLINIC_OR_DEPARTMENT_OTHER): Payer: 59 | Admitting: Hematology and Oncology

## 2023-09-20 ENCOUNTER — Encounter: Payer: Self-pay | Admitting: Hematology and Oncology

## 2023-09-20 ENCOUNTER — Inpatient Hospital Stay: Payer: 59 | Attending: Hematology and Oncology

## 2023-09-20 VITALS — BP 147/68 | HR 58 | Temp 97.6°F | Resp 18 | Wt 144.0 lb

## 2023-09-20 DIAGNOSIS — Z8547 Personal history of malignant neoplasm of testis: Secondary | ICD-10-CM | POA: Insufficient documentation

## 2023-09-20 DIAGNOSIS — C9110 Chronic lymphocytic leukemia of B-cell type not having achieved remission: Secondary | ICD-10-CM

## 2023-09-20 DIAGNOSIS — Z8701 Personal history of pneumonia (recurrent): Secondary | ICD-10-CM | POA: Insufficient documentation

## 2023-09-20 DIAGNOSIS — D72820 Lymphocytosis (symptomatic): Secondary | ICD-10-CM | POA: Diagnosis present

## 2023-09-20 LAB — CBC WITH DIFFERENTIAL (CANCER CENTER ONLY)
Abs Immature Granulocytes: 0.07 10*3/uL (ref 0.00–0.07)
Basophils Absolute: 0 10*3/uL (ref 0.0–0.1)
Basophils Relative: 0 %
Eosinophils Absolute: 0 10*3/uL (ref 0.0–0.5)
Eosinophils Relative: 0 %
HCT: 41.1 % (ref 39.0–52.0)
Hemoglobin: 13.2 g/dL (ref 13.0–17.0)
Immature Granulocytes: 0 %
Lymphocytes Relative: 89 %
Lymphs Abs: 51.1 10*3/uL — ABNORMAL HIGH (ref 0.7–4.0)
MCH: 30.5 pg (ref 26.0–34.0)
MCHC: 32.1 g/dL (ref 30.0–36.0)
MCV: 94.9 fL (ref 80.0–100.0)
Monocytes Absolute: 3.2 10*3/uL — ABNORMAL HIGH (ref 0.1–1.0)
Monocytes Relative: 6 %
Neutro Abs: 3.1 10*3/uL (ref 1.7–7.7)
Neutrophils Relative %: 5 %
Platelet Count: 122 10*3/uL — ABNORMAL LOW (ref 150–400)
RBC: 4.33 MIL/uL (ref 4.22–5.81)
RDW: 14.6 % (ref 11.5–15.5)
Smear Review: DECREASED
WBC Count: 57.5 10*3/uL (ref 4.0–10.5)
nRBC: 0 % (ref 0.0–0.2)

## 2023-09-20 LAB — CMP (CANCER CENTER ONLY)
ALT: 18 U/L (ref 0–44)
AST: 26 U/L (ref 15–41)
Albumin: 4.3 g/dL (ref 3.5–5.0)
Alkaline Phosphatase: 82 U/L (ref 38–126)
Anion gap: 5 (ref 5–15)
BUN: 26 mg/dL — ABNORMAL HIGH (ref 8–23)
CO2: 28 mmol/L (ref 22–32)
Calcium: 9.2 mg/dL (ref 8.9–10.3)
Chloride: 108 mmol/L (ref 98–111)
Creatinine: 1.05 mg/dL (ref 0.61–1.24)
GFR, Estimated: >60 mL/min (ref >60)
Glucose, Bld: 78 mg/dL (ref 70–99)
Potassium: 4.2 mmol/L (ref 3.5–5.1)
Sodium: 141 mmol/L (ref 135–145)
Total Bilirubin: 0.6 mg/dL (ref <1.2)
Total Protein: 6.7 g/dL (ref 6.5–8.1)

## 2023-09-20 NOTE — Progress Notes (Signed)
[facility] CONSULT NOTE  Patient Care Team: [name], MD as PCP - General  CHIEF COMPLAINTS/PURPOSE OF CONSULTATION:   Lymphocytosis, follow up  ASSESSMENT & PLAN:   CLL (chronic lymphocytic leukemia) (HCC) This is a very pleasant 62 year old male patient with past medical history significant for testicular cancer referred to hematology for evaluation and treatment of CLL.  FISH for CLL showed 13 q. deletion consistent with favorable prognosis, IgH V somatic hyper mutation not detected. No concerning review of systems.  He remains clinically asymptomatic.   Physical examination unremarkable, no palpable lymphadenopathy or hepatosplenomegaly.  We reviewed his labs which showed leukocytosis, lymphocytosis, ongoing thrombocytopenia. Besides uptrending leukocytosis and lymphocytosis, he has no other indications for treatment.  Will continue to monitor his labs every 3 to 4 months. He understands that if he has anemia or thrombo-cytopenia secondary to CLL, recurrent infections, hepatosplenomegaly or palpable lymphadenopathy we may consider treatment. Return to clinic in 3 to 4 months with repeat labs.  Rachel Moulds MD     Orders Placed This Encounter  Procedures   Lactate dehydrogenase    Standing Status:   Standing    Number of Occurrences:   20    Standing Expiration Date:   09/19/2024    HISTORY OF PRESENTING ILLNESS:   Harold Benton 62 y.o. male is here because of lymphocytosis.  This is a very pleasant 62 year old male patient with past medical history significant for testicular cancer, recent pneumonia in February 2022 with multiple courses of antibiotics referred to hematology for evaluation of lymphocytosis.   Flow confirmed monoclonal B-cell population. FISH suggested mono allelic deletion of 13 q. 14 no evidence of 17 P/ATM or trisomy 12 IGVH somatic hyper mutation was not detected.  Interim History  [name] is here for a follow up.    Since last visit, he denies no major change in health.  No fevers, drenching night sweats, loss of appetite or loss of weight.  He is still running regularly. Since his last visit, he had an upper respiratory tract infection requiring antibiotics, this is his only infection for the entire year. His wife had an unfortunate accident in the had a dislocation of the left knee joint and is going through a lot of agony related to it.  Besides this, his daughter, son-in-law and granddaughter have been living with them while waiting for the new home and he is having a great time with his granddaughter.  Rest of the pertinent 10 point ROS reviewed and negative  MEDICAL HISTORY:  Past Medical History:  Diagnosis Date   Pneumonia    Testicular cancer (HCC) 1984    SURGICAL HISTORY: Past Surgical History:  Procedure Laterality Date   EXPLORATORY LAPAROTOMY  11/02/1982   INGUINAL HERNIA REPAIR Left 09/18/2022   Procedure: OPEN LEFT INDIRECT INGUINAL HERNIA REPAIR WITH MESH;  Surgeon: Luretha Murphy, MD;  Location: [facility];  Service: General;  Laterality: Left;  indirect hernia with mesh   tecticle removal  11/02/1982   WISDOM TOOTH EXTRACTION      SOCIAL HISTORY: Social History   Socioeconomic History   Marital status: Married    Spouse name: Not on file   Number of children: Not on file   Years of education: Not on file   Highest education level: Bachelor's degree (e.g., BA, AB, BS)  Occupational History   Not on file  Tobacco Use   Smoking status: Never   Smokeless tobacco: Never  Vaping Use   Vaping  status: Never Used  Substance and Sexual Activity   Alcohol use: Yes    Alcohol/week: 6.0 standard drinks of alcohol    Types: 6 Cans of beer per week   Drug use: No   Sexual activity: Yes  Other Topics Concern   Not on file  Social History Narrative   Not on file   Social Determinants of Health   Financial Resource Strain: Low Risk  (01/12/2022)   Overall Financial Resource  Strain (CARDIA)    Difficulty of Paying Living Expenses: Not hard at all  Food Insecurity: No Food Insecurity (01/12/2022)   Hunger Vital Sign    Worried About Running Out of Food in the Last Year: Never true    Ran Out of Food in the Last Year: Never true  Transportation Needs: No Transportation Needs (01/12/2022)   PRAPARE - Administrator, Civil Service (Medical): No    Lack of Transportation (Non-Medical): No  Physical Activity: Sufficiently Active (01/12/2022)   Exercise Vital Sign    Days of Exercise per Week: 5 days    Minutes of Exercise per Session: 40 min  Stress: No Stress Concern Present (01/12/2022)   [employer] of Occupational Health - Occupational Stress Questionnaire    Feeling of Stress : Not at all  Social Connections: Socially Integrated (01/12/2022)   Social Connection and Isolation Panel [NHANES]    Frequency of Communication with Friends and Family: More than three times a week    Frequency of Social Gatherings with Friends and Family: More than three times a week    Attends Religious Services: More than 4 times per year    Active Member of [employer] or Organizations: Yes    Attends Engineer, structural: More than 4 times per year    Marital Status: Married  Catering manager Violence: Not on file    FAMILY HISTORY: Family History  Problem Relation Age of Onset   Cancer Mother        ?type   Cancer Father        CLL   Alcohol abuse Sister    Colon cancer Neg Hx    Stomach cancer Neg Hx     ALLERGIES:  has No Known Allergies.  MEDICATIONS:  Current Outpatient Medications  Medication Sig Dispense Refill   Ascorbic Acid (VITAMIN C) 1000 MG tablet Take 1,000 mg by mouth daily.     azithromycin (ZITHROMAX) 250 MG tablet Take as directed 6 tablet 0   CALCIUM-VITAMIN D PO Take 1 tablet by mouth daily.     Multiple Vitamin (MULTIVITAMIN) tablet Take 1 tablet by mouth daily.     No current facility-administered medications for this  visit.    PHYSICAL EXAMINATION: ECOG PERFORMANCE STATUS: 0 - Asymptomatic  Vitals:   09/20/23 1219  BP: (!) 147/68  Pulse: (!) 58  Resp: 18  Temp: 97.6 F (36.4 C)  SpO2: 100%    Filed Weights   09/20/23 1219  Weight: 144 lb (65.3 kg)   Physical Exam Constitutional:      Appearance: Normal appearance.  HENT:     Head: Normocephalic and atraumatic.  Cardiovascular:     Rate and Rhythm: Normal rate and regular rhythm.     Pulses: Normal pulses.     Heart sounds: Normal heart sounds.  Pulmonary:     Effort: Pulmonary effort is normal.     Breath sounds: Normal breath sounds.  Abdominal:     General: Abdomen is flat. There is no  distension.     Palpations: Abdomen is soft. There is no mass.     Tenderness: There is no abdominal tenderness.  Musculoskeletal:        General: No swelling or tenderness.     Cervical back: Normal range of motion and neck supple. No rigidity.  Lymphadenopathy:     Cervical: No cervical adenopathy.  Skin:    General: Skin is warm and dry.  Neurological:     General: No focal deficit present.     Mental Status: He is alert.  Psychiatric:        Mood and Affect: Mood normal.     LABORATORY DATA:  I have reviewed the data as listed Lab Results  Component Value Date   WBC 57.5 (HH) 09/20/2023   HGB 13.2 09/20/2023   HCT 41.1 09/20/2023   MCV 94.9 09/20/2023   PLT 122 (L) 09/20/2023     Chemistry      Component Value Date/Time   NA 141 09/20/2023 1138   K 4.2 09/20/2023 1138   CL 108 09/20/2023 1138   CO2 28 09/20/2023 1138   BUN 26 (H) 09/20/2023 1138   CREATININE 1.05 09/20/2023 1138   CREATININE 1.10 07/22/2020 1358      Component Value Date/Time   CALCIUM 9.2 09/20/2023 1138   ALKPHOS 82 09/20/2023 1138   AST 26 09/20/2023 1138   ALT 18 09/20/2023 1138   BILITOT 0.6 09/20/2023 1138     Flow consistent with CLL, 13 q deletion, favorable prognosis. CBC from today showed WBC 57,500, normal hemoglobin and mildly low  platelet count.  CMP otherwise normal.  RADIOGRAPHIC STUDIES: I have personally reviewed the radiological images as listed and agreed with the findings in the report.  No results found.  All questions were answered. The patient knows to call the clinic with any problems, questions or concerns. I spent 30 minutes in the care of this patient including H and P, review of records, counseling and coordination of care.   Rachel Moulds, MD 09/20/2023 3:24 PM

## 2023-09-20 NOTE — Assessment & Plan Note (Signed)
This is a very pleasant 62 year old male patient with past medical history significant for testicular cancer referred to hematology for evaluation and treatment of CLL.  FISH for CLL showed 13 q. deletion consistent with favorable prognosis, IgH V somatic hyper mutation not detected. No concerning review of systems.  He remains clinically asymptomatic.   Physical examination unremarkable, no palpable lymphadenopathy or hepatosplenomegaly.  We reviewed his labs which showed leukocytosis, lymphocytosis, ongoing thrombocytopenia. Besides uptrending leukocytosis and lymphocytosis, he has no other indications for treatment.  Will continue to monitor his labs every 3 to 4 months. He understands that if he has anemia or thrombo-cytopenia secondary to CLL, recurrent infections, hepatosplenomegaly or palpable lymphadenopathy we may consider treatment. Return to clinic in 3 to 4 months with repeat labs.  Rachel Moulds MD

## 2023-09-23 ENCOUNTER — Other Ambulatory Visit (HOSPITAL_BASED_OUTPATIENT_CLINIC_OR_DEPARTMENT_OTHER): Payer: Self-pay

## 2023-09-23 MED ORDER — INFLUENZA VIRUS VACC SPLIT PF (FLUZONE) 0.5 ML IM SUSY
0.5000 mL | PREFILLED_SYRINGE | Freq: Once | INTRAMUSCULAR | 0 refills | Status: AC
  Filled 2023-09-23: qty 0.5, 1d supply, fill #0

## 2023-12-17 ENCOUNTER — Encounter: Payer: Self-pay | Admitting: Pulmonary Disease

## 2023-12-17 DIAGNOSIS — J329 Chronic sinusitis, unspecified: Secondary | ICD-10-CM

## 2023-12-17 MED ORDER — AMOXICILLIN-POT CLAVULANATE 875-125 MG PO TABS: 1.0000 | ORAL_TABLET | Freq: Two times a day (BID) | ORAL | 0 refills | Status: DC

## 2023-12-27 ENCOUNTER — Encounter: Payer: Self-pay | Admitting: Hematology and Oncology

## 2023-12-27 ENCOUNTER — Inpatient Hospital Stay (HOSPITAL_BASED_OUTPATIENT_CLINIC_OR_DEPARTMENT_OTHER): Payer: 59 | Admitting: Hematology and Oncology

## 2023-12-27 ENCOUNTER — Other Ambulatory Visit: Payer: Self-pay | Admitting: *Deleted

## 2023-12-27 ENCOUNTER — Inpatient Hospital Stay: Payer: 59 | Attending: Hematology and Oncology

## 2023-12-27 VITALS — BP 142/65 | HR 60 | Temp 98.2°F | Resp 16 | Wt 141.6 lb

## 2023-12-27 DIAGNOSIS — Z806 Family history of leukemia: Secondary | ICD-10-CM | POA: Insufficient documentation

## 2023-12-27 DIAGNOSIS — N390 Urinary tract infection, site not specified: Secondary | ICD-10-CM | POA: Insufficient documentation

## 2023-12-27 DIAGNOSIS — D72820 Lymphocytosis (symptomatic): Secondary | ICD-10-CM | POA: Diagnosis present

## 2023-12-27 DIAGNOSIS — C9110 Chronic lymphocytic leukemia of B-cell type not having achieved remission: Secondary | ICD-10-CM

## 2023-12-27 DIAGNOSIS — Z809 Family history of malignant neoplasm, unspecified: Secondary | ICD-10-CM | POA: Diagnosis not present

## 2023-12-27 LAB — CBC WITH DIFFERENTIAL/PLATELET
Abs Immature Granulocytes: 0.08 10*3/uL — ABNORMAL HIGH (ref 0.00–0.07)
Basophils Absolute: 0.1 10*3/uL (ref 0.0–0.1)
Basophils Relative: 0 %
Eosinophils Absolute: 0.1 10*3/uL (ref 0.0–0.5)
Eosinophils Relative: 0 %
HCT: 35.8 % — ABNORMAL LOW (ref 39.0–52.0)
Hemoglobin: 11.6 g/dL — ABNORMAL LOW (ref 13.0–17.0)
Immature Granulocytes: 0 %
Lymphocytes Relative: 80 %
Lymphs Abs: 31.5 10*3/uL — ABNORMAL HIGH (ref 0.7–4.0)
MCH: 29.7 pg (ref 26.0–34.0)
MCHC: 32.4 g/dL (ref 30.0–36.0)
MCV: 91.6 fL (ref 80.0–100.0)
Monocytes Absolute: 3.3 10*3/uL — ABNORMAL HIGH (ref 0.1–1.0)
Monocytes Relative: 9 %
Neutro Abs: 4.1 10*3/uL (ref 1.7–7.7)
Neutrophils Relative %: 11 %
Platelets: 212 10*3/uL (ref 150–400)
RBC: 3.91 MIL/uL — ABNORMAL LOW (ref 4.22–5.81)
RDW: 14.6 % (ref 11.5–15.5)
Smear Review: NORMAL
WBC: 39.2 10*3/uL — ABNORMAL HIGH (ref 4.0–10.5)
nRBC: 0 % (ref 0.0–0.2)

## 2023-12-27 LAB — BASIC METABOLIC PANEL - CANCER CENTER ONLY
Anion gap: 5 (ref 5–15)
BUN: 22 mg/dL (ref 8–23)
CO2: 27 mmol/L (ref 22–32)
Calcium: 9.2 mg/dL (ref 8.9–10.3)
Chloride: 111 mmol/L (ref 98–111)
Creatinine: 0.97 mg/dL (ref 0.61–1.24)
GFR, Estimated: >60 mL/min (ref >60)
Glucose, Bld: 81 mg/dL (ref 70–99)
Potassium: 4.2 mmol/L (ref 3.5–5.1)
Sodium: 143 mmol/L (ref 135–145)

## 2023-12-27 LAB — LACTATE DEHYDROGENASE: LDH: 126 U/L (ref 98–192)

## 2023-12-27 MED ORDER — AZITHROMYCIN 250 MG PO TABS: ORAL_TABLET | ORAL | 0 refills | Status: DC

## 2023-12-27 NOTE — Progress Notes (Signed)
 [facility] CONSULT NOTE  Patient Care Team: [name], MD as PCP - General  CHIEF COMPLAINTS/PURPOSE OF CONSULTATION:   Lymphocytosis, follow up  ASSESSMENT & PLAN:   CLL (chronic lymphocytic leukemia) (HCC) This is a very pleasant 63 year old male patient with past medical history significant for testicular cancer referred to hematology for evaluation and treatment of CLL.  FISH for CLL showed 13 q. deletion consistent with favorable prognosis, IgH V somatic hyper mutation not detected.  Chronic Lymphocytic Leukemia (CLL) Stable disease with a decrease in white blood cell count (39,200 from 57,000). Mild anemia (Hb 11.6) noted, possibly related to CLL progression. No new lymphadenopathy or hepatosplenomegaly on examination. No B symptoms reported except for one episode of night sweats, possibly related to concurrent respiratory infection. -Continue monitoring, next follow-up in 4 months.  Upper Respiratory Tract Infection Persistent chest congestion and cough despite a week of Amoxicillin. No improvement in symptoms. -Switch to Azithromycin (Z-Pak), prescription to be sent to [facility] pharmacy. -If symptoms persist, consider consultation with pulmonologist Dr. Zenon Mayo.  Rachel Moulds MD     No orders of the defined types were placed in this encounter.   HISTORY OF PRESENTING ILLNESS:   Harold Benton 63 y.o. male is here because of lymphocytosis.  This is a very pleasant 63 year old male patient with past medical history significant for testicular cancer, recent pneumonia in February 2022 with multiple courses of antibiotics referred to hematology for evaluation of lymphocytosis.   Flow confirmed monoclonal B-cell population. FISH suggested mono allelic deletion of 13 q. 14 no evidence of 17 P/ATM or trisomy 12 IGVH somatic hyper mutation was not detected.  Interim History  Harold Benton "Harold Benton" is a 63 year old male with chronic lymphocytic  leukemia (CLL) who presents for follow-up and evaluation of respiratory symptoms.  He has been experiencing chest congestion and a persistent cough, initially producing colored mucus. A one-week course of amoxicillin initially reduced the symptoms, but the congestion and cough have since returned, with an increase in mucus volume. He has a history of recurrent upper respiratory tract infections occurring a couple of times a year, without an increase in frequency. No new or enlarging lymph nodes are noted.  Since his last visit in November, he has experienced night sweats, which he attributes to a new thicker comforter and a recent illness. A significant episode of night sweats occurred in January, coinciding with chest congestion and a low-grade fever. No significant fatigue, weight loss, or loss of appetite.  His current medications include a recent course of amoxicillin, completed on Friday. He has previously found azithromycin (Z-Pak) to be more effective for similar symptoms.  He has not been running in the past couple of weeks due to chest congestion but attempted to resume on Thursday.  Rest of the pertinent 10 point ROS reviewed and negative  MEDICAL HISTORY:  Past Medical History:  Diagnosis Date   Pneumonia    Testicular cancer (HCC) 1984    SURGICAL HISTORY: Past Surgical History:  Procedure Laterality Date   EXPLORATORY LAPAROTOMY  11/02/1982   INGUINAL HERNIA REPAIR Left 09/18/2022   Procedure: OPEN LEFT INDIRECT INGUINAL HERNIA REPAIR WITH MESH;  Surgeon: Luretha Murphy, MD;  Location: [facility];  Service: General;  Laterality: Left;  indirect hernia with mesh   tecticle removal  11/02/1982   WISDOM TOOTH EXTRACTION      SOCIAL HISTORY: Social History   Socioeconomic History   Marital status: Married    Spouse name:  Not on file   Number of children: Not on file   Years of education: Not on file   Highest education level: Bachelor's degree (e.g., BA, AB, BS)   Occupational History   Not on file  Tobacco Use   Smoking status: Never   Smokeless tobacco: Never  Vaping Use   Vaping status: Never Used  Substance and Sexual Activity   Alcohol use: Yes    Alcohol/week: 6.0 standard drinks of alcohol    Types: 6 Cans of beer per week   Drug use: No   Sexual activity: Yes  Other Topics Concern   Not on file  Social History Narrative   Not on file   Social Drivers of Health   Financial Resource Strain: Low Risk  (01/12/2022)   Overall Financial Resource Strain (CARDIA)    Difficulty of Paying Living Expenses: Not hard at all  Food Insecurity: No Food Insecurity (01/12/2022)   Hunger Vital Sign    Worried About Running Out of Food in the Last Year: Never true    Ran Out of Food in the Last Year: Never true  Transportation Needs: No Transportation Needs (01/12/2022)   PRAPARE - Administrator, Civil Service (Medical): No    Lack of Transportation (Non-Medical): No  Physical Activity: Sufficiently Active (01/12/2022)   Exercise Vital Sign    Days of Exercise per Week: 5 days    Minutes of Exercise per Session: 40 min  Stress: No Stress Concern Present (01/12/2022)   [employer] of Occupational Health - Occupational Stress Questionnaire    Feeling of Stress : Not at all  Social Connections: Socially Integrated (01/12/2022)   Social Connection and Isolation Panel [NHANES]    Frequency of Communication with Friends and Family: More than three times a week    Frequency of Social Gatherings with Friends and Family: More than three times a week    Attends Religious Services: More than 4 times per year    Active Member of [employer] or Organizations: Yes    Attends Engineer, structural: More than 4 times per year    Marital Status: Married  Catering manager Violence: Not on file    FAMILY HISTORY: Family History  Problem Relation Age of Onset   Cancer Mother        ?type   Cancer Father        CLL   Alcohol abuse Sister     Colon cancer Neg Hx    Stomach cancer Neg Hx     ALLERGIES:  has no known allergies.  MEDICATIONS:  Current Outpatient Medications  Medication Sig Dispense Refill   amoxicillin-clavulanate (AUGMENTIN) 875-125 MG tablet Take 1 tablet by mouth 2 (two) times daily. 14 tablet 0   Ascorbic Acid (VITAMIN C) 1000 MG tablet Take 1,000 mg by mouth daily.     CALCIUM-VITAMIN D PO Take 1 tablet by mouth daily.     Multiple Vitamin (MULTIVITAMIN) tablet Take 1 tablet by mouth daily.     No current facility-administered medications for this visit.    PHYSICAL EXAMINATION: ECOG PERFORMANCE STATUS: 0 - Asymptomatic  Vitals:   12/27/23 1157  BP: (!) 142/65  Pulse: 60  Resp: 16  Temp: 98.2 F (36.8 C)  SpO2: 100%    Filed Weights   12/27/23 1157  Weight: 141 lb 9.6 oz (64.2 kg)   Physical Exam Constitutional:      Appearance: Normal appearance.  HENT:     Head: Normocephalic and  atraumatic.  Cardiovascular:     Rate and Rhythm: Normal rate and regular rhythm.     Pulses: Normal pulses.     Heart sounds: Normal heart sounds.  Pulmonary:     Effort: Pulmonary effort is normal.     Breath sounds: Rhonchi present.  Abdominal:     General: Abdomen is flat. There is no distension.     Palpations: Abdomen is soft. There is no mass.     Tenderness: There is no abdominal tenderness.  Musculoskeletal:        General: No swelling or tenderness.     Cervical back: Normal range of motion and neck supple. No rigidity.  Lymphadenopathy:     Cervical: No cervical adenopathy.  Skin:    General: Skin is warm and dry.  Neurological:     General: No focal deficit present.     Mental Status: He is alert.  Psychiatric:        Mood and Affect: Mood normal.     LABORATORY DATA:  I have reviewed the data as listed Lab Results  Component Value Date   WBC 39.2 (H) 12/27/2023   HGB 11.6 (L) 12/27/2023   HCT 35.8 (L) 12/27/2023   MCV 91.6 12/27/2023   PLT 212 12/27/2023     Chemistry       Component Value Date/Time   NA 143 12/27/2023 1137   K 4.2 12/27/2023 1137   CL 111 12/27/2023 1137   CO2 27 12/27/2023 1137   BUN 22 12/27/2023 1137   CREATININE 0.97 12/27/2023 1137   CREATININE 1.10 07/22/2020 1358      Component Value Date/Time   CALCIUM 9.2 12/27/2023 1137   ALKPHOS 82 09/20/2023 1138   AST 26 09/20/2023 1138   ALT 18 09/20/2023 1138   BILITOT 0.6 09/20/2023 1138     Flow consistent with CLL, 13 q deletion, favorable prognosis. CBC from today reviewed.  RADIOGRAPHIC STUDIES: I have personally reviewed the radiological images as listed and agreed with the findings in the report.  No results found.  All questions were answered. The patient knows to call the clinic with any problems, questions or concerns.    Rachel Moulds, MD 12/27/2023 12:21 PM

## 2023-12-27 NOTE — Assessment & Plan Note (Signed)
 This is a very pleasant 63 year old male patient with past medical history significant for testicular cancer referred to hematology for evaluation and treatment of CLL.  FISH for CLL showed 13 q. deletion consistent with favorable prognosis, IgH V somatic hyper mutation not detected.  Chronic Lymphocytic Leukemia (CLL) Stable disease with a decrease in white blood cell count (39,200 from 57,000). Mild anemia (Hb 11.6) noted, possibly related to CLL progression. No new lymphadenopathy or hepatosplenomegaly on examination. No B symptoms reported except for one episode of night sweats, possibly related to concurrent respiratory infection. -Continue monitoring, next follow-up in 4 months.  Upper Respiratory Tract Infection Persistent chest congestion and cough despite a week of Amoxicillin. No improvement in symptoms. -Switch to Azithromycin (Z-Pak), prescription to be sent to [facility] pharmacy. -If symptoms persist, consider consultation with pulmonologist Dr. Zenon Mayo.  Rachel Moulds MD

## 2024-04-13 ENCOUNTER — Telehealth: Payer: Self-pay

## 2024-04-13 NOTE — Telephone Encounter (Signed)
 Verbally confirmed appt for 6/16

## 2024-04-17 ENCOUNTER — Other Ambulatory Visit: Payer: Self-pay | Admitting: *Deleted

## 2024-04-17 ENCOUNTER — Inpatient Hospital Stay

## 2024-04-17 ENCOUNTER — Other Ambulatory Visit: Payer: Self-pay | Admitting: Hematology and Oncology

## 2024-04-17 ENCOUNTER — Inpatient Hospital Stay (HOSPITAL_BASED_OUTPATIENT_CLINIC_OR_DEPARTMENT_OTHER): Payer: 59 | Admitting: Hematology and Oncology

## 2024-04-17 ENCOUNTER — Inpatient Hospital Stay: Payer: 59 | Attending: Hematology and Oncology

## 2024-04-17 VITALS — BP 133/57 | HR 53 | Temp 98.4°F | Resp 17 | Ht 70.0 in | Wt 143.5 lb

## 2024-04-17 DIAGNOSIS — C9110 Chronic lymphocytic leukemia of B-cell type not having achieved remission: Secondary | ICD-10-CM

## 2024-04-17 DIAGNOSIS — Z8547 Personal history of malignant neoplasm of testis: Secondary | ICD-10-CM | POA: Insufficient documentation

## 2024-04-17 DIAGNOSIS — L570 Actinic keratosis: Secondary | ICD-10-CM | POA: Insufficient documentation

## 2024-04-17 DIAGNOSIS — D72820 Lymphocytosis (symptomatic): Secondary | ICD-10-CM | POA: Diagnosis present

## 2024-04-17 LAB — CBC WITH DIFFERENTIAL (CANCER CENTER ONLY)
Abs Immature Granulocytes: 0 10*3/uL (ref 0.00–0.07)
Basophils Absolute: 0 10*3/uL (ref 0.0–0.1)
Basophils Relative: 0 %
Eosinophils Absolute: 0 10*3/uL (ref 0.0–0.5)
Eosinophils Relative: 0 %
HCT: 41.8 % (ref 39.0–52.0)
Hemoglobin: 13.4 g/dL (ref 13.0–17.0)
Lymphocytes Relative: 95 %
Lymphs Abs: 65.6 10*3/uL — ABNORMAL HIGH (ref 0.7–4.0)
MCH: 29.5 pg (ref 26.0–34.0)
MCHC: 32.1 g/dL (ref 30.0–36.0)
MCV: 92.1 fL (ref 80.0–100.0)
Monocytes Absolute: 0 10*3/uL — ABNORMAL LOW (ref 0.1–1.0)
Monocytes Relative: 0 %
Neutro Abs: 3.5 10*3/uL (ref 1.7–7.7)
Neutrophils Relative %: 5 %
Platelet Count: 104 10*3/uL — ABNORMAL LOW (ref 150–400)
RBC: 4.54 MIL/uL (ref 4.22–5.81)
RDW: 14.2 % (ref 11.5–15.5)
Smear Review: NORMAL
WBC Count: 69.1 10*3/uL (ref 4.0–10.5)
nRBC: 0 % (ref 0.0–0.2)

## 2024-04-17 LAB — CMP (CANCER CENTER ONLY)
ALT: 19 U/L (ref 0–44)
AST: 27 U/L (ref 15–41)
Albumin: 4.7 g/dL (ref 3.5–5.0)
Alkaline Phosphatase: 82 U/L (ref 38–126)
Anion gap: 6 (ref 5–15)
BUN: 23 mg/dL (ref 8–23)
CO2: 29 mmol/L (ref 22–32)
Calcium: 9.9 mg/dL (ref 8.9–10.3)
Chloride: 106 mmol/L (ref 98–111)
Creatinine: 1.05 mg/dL (ref 0.61–1.24)
GFR, Estimated: >60 mL/min (ref >60)
Glucose, Bld: 71 mg/dL (ref 70–99)
Potassium: 4.2 mmol/L (ref 3.5–5.1)
Sodium: 141 mmol/L (ref 135–145)
Total Bilirubin: 0.7 mg/dL (ref 0.0–1.2)
Total Protein: 7.2 g/dL (ref 6.5–8.1)

## 2024-04-17 LAB — LACTATE DEHYDROGENASE: LDH: 129 U/L (ref 98–192)

## 2024-04-17 NOTE — Progress Notes (Unsigned)
 CRITICAL VALUE STICKER  CRITICAL VALUE: WBC 69.1  RECEIVER (on-site recipient of call): Odilia Bennett   DATE & TIME NOTIFIED: 04/17/24 @ 12:38  MESSENGER (representative from lab): Camilo Cella   MD NOTIFIED: Dr. Arno Bibles  TIME OF NOTIFICATION: 12:39  RESPONSE: patient has CLL

## 2024-04-17 NOTE — Progress Notes (Signed)
 Called him with the lab which showed an increased lymphocyte count, resolution of anemia and mild thrombocytopenia consistent with his CLL presentation.  Will repeat the labs in 1 month and see if he continues to have an upward trend.  He is agreeable to this plan.  Lab appointment made for July

## 2024-04-17 NOTE — Assessment & Plan Note (Signed)
 This is a very pleasant 63 year old male patient with past medical history significant for testicular cancer referred to hematology for evaluation and treatment of CLL.  FISH for CLL showed 13 q. deletion consistent with favorable prognosis, IgH V somatic hyper mutation not detected.  Assessment and Plan Assessment & Plan Chronic Lymphocytic Leukemia (CLL) No concerning ROS or PE findings CBC pending. If its stable with no worsening leukopenia or thrombocytopenia, we will continue to monitor it. - Schedule follow-up in three months unless lab results necessitate earlier visit.  Actinic Keratosis Actinic Keratosis on the scalp treated with topical liquid medication. No direct correlation with CLL identified. - Continue topical treatment for Actinic Keratosis as prescribed by dermatologist.    Murleen Arms MD

## 2024-04-17 NOTE — Progress Notes (Signed)
 [name] CONSULT NOTE  Patient Care Team: Marquetta Sit, MD as PCP - General  CHIEF COMPLAINTS/PURPOSE OF CONSULTATION:   Lymphocytosis, follow up  ASSESSMENT & PLAN:   CLL (chronic lymphocytic leukemia) (HCC) This is a very pleasant 63 year old male patient with past medical history significant for testicular cancer referred to hematology for evaluation and treatment of CLL.  FISH for CLL showed 13 q. deletion consistent with favorable prognosis, IgH V somatic hyper mutation not detected.  Assessment and Plan Assessment & Plan Chronic Lymphocytic Leukemia (CLL) No concerning ROS or PE findings CBC pending. If its stable with no worsening leukopenia or thrombocytopenia, we will continue to monitor it. - Schedule follow-up in three months unless lab results necessitate earlier visit.  Actinic Keratosis Actinic Keratosis on the scalp treated with topical liquid medication. No direct correlation with CLL identified. - Continue topical treatment for Actinic Keratosis as prescribed by dermatologist.    Murleen Arms MD     Orders Placed This Encounter  Procedures   CBC with Differential/Platelet    Standing Status:   Standing    Number of Occurrences:   20    Expiration Date:   04/17/2025   CMP (Cancer Center only)    Standing Status:   Standing    Number of Occurrences:   20    Expiration Date:   04/17/2025    HISTORY OF PRESENTING ILLNESS:   Harold Benton 63 y.o. male is here because of lymphocytosis.  This is a very pleasant 63 year old male patient with past medical history significant for testicular cancer, recent pneumonia in February 2022 with multiple courses of antibiotics referred to hematology for evaluation of lymphocytosis.   Flow confirmed monoclonal B-cell population. FISH suggested mono allelic deletion of 13 q. 14 no evidence of 17 P/ATM or trisomy 12 IGVH somatic hyper mutation was not detected.  Interim History  Discussed  the use of AI scribe software for clinical note transcription with the patient, who gave verbal consent to proceed.  History of Present Illness Harold Benton is a 63 year old male with chronic lymphocytic leukemia (CLL) who presents for routine follow-up and lab work.  He feels 'pretty much the same' as before, with no new symptoms such as fevers or drenching night sweats. His energy levels are sufficient to continue running, although he notes some fatigue and a decrease in his running performance compared to a few years ago. No recent colds or infections have occurred.  He mentions a dermatological issue with spots on his head, diagnosed as actinic keratosis by a dermatologist. He is using a topical liquid treatment for this condition, which he applies to his scalp. He inquires about any potential correlation between his CLL and the skin condition, noting that he has had these spots for some time and tends to pick at them, causing them to return.  He is not currently taking any medications regularly, having recently completed courses of amoxicillin  and a Z-Pak for a past infection. He maintains a healthy diet with adequate protein and carbohydrates.   Rest of the pertinent 10 point ROS reviewed and negative  MEDICAL HISTORY:  Past Medical History:  Diagnosis Date   Pneumonia    Testicular cancer (HCC) 1984    SURGICAL HISTORY: Past Surgical History:  Procedure Laterality Date   EXPLORATORY LAPAROTOMY  11/02/1982   INGUINAL HERNIA REPAIR Left 09/18/2022   Procedure: OPEN LEFT INDIRECT INGUINAL HERNIA REPAIR WITH MESH;  Surgeon: Jacolyn Matar, MD;  Location: [facility];  Service: General;  Laterality: Left;  indirect hernia with mesh   tecticle removal  11/02/1982   WISDOM TOOTH EXTRACTION      SOCIAL HISTORY: Social History   Socioeconomic History   Marital status: Married    Spouse name: Not on file   Number of children: Not on file   Years of education: Not on file    Highest education level: Bachelor's degree (e.g., BA, AB, BS)  Occupational History   Not on file  Tobacco Use   Smoking status: Never   Smokeless tobacco: Never  Vaping Use   Vaping status: Never Used  Substance and Sexual Activity   Alcohol use: Yes    Alcohol/week: 6.0 standard drinks of alcohol    Types: 6 Cans of beer per week   Drug use: No   Sexual activity: Yes  Other Topics Concern   Not on file  Social History Narrative   Not on file   Social Drivers of Health   Financial Resource Strain: Low Risk  (01/12/2022)   Overall Financial Resource Strain (CARDIA)    Difficulty of Paying Living Expenses: Not hard at all  Food Insecurity: No Food Insecurity (01/12/2022)   Hunger Vital Sign    Worried About Running Out of Food in the Last Year: Never true    Ran Out of Food in the Last Year: Never true  Transportation Needs: No Transportation Needs (01/12/2022)   PRAPARE - Administrator, Civil Service (Medical): No    Lack of Transportation (Non-Medical): No  Physical Activity: Sufficiently Active (01/12/2022)   Exercise Vital Sign    Days of Exercise per Week: 5 days    Minutes of Exercise per Session: 40 min  Stress: No Stress Concern Present (01/12/2022)   [employer] of Occupational Health - Occupational Stress Questionnaire    Feeling of Stress : Not at all  Social Connections: Socially Integrated (01/12/2022)   Social Connection and Isolation Panel    Frequency of Communication with Friends and Family: More than three times a week    Frequency of Social Gatherings with Friends and Family: More than three times a week    Attends Religious Services: More than 4 times per year    Active Member of [employer] or Organizations: Yes    Attends Engineer, structural: More than 4 times per year    Marital Status: Married  Catering manager Violence: Not on file    FAMILY HISTORY: Family History  Problem Relation Age of Onset   Cancer Mother        ?type    Cancer Father        CLL   Alcohol abuse Sister    Colon cancer Neg Hx    Stomach cancer Neg Hx     ALLERGIES:  has no known allergies.  MEDICATIONS:  Current Outpatient Medications  Medication Sig Dispense Refill   Ascorbic Acid (VITAMIN C) 1000 MG tablet Take 1,000 mg by mouth daily.     CALCIUM-VITAMIN D PO Take 1 tablet by mouth daily.     Multiple Vitamin (MULTIVITAMIN) tablet Take 1 tablet by mouth daily.     No current facility-administered medications for this visit.    PHYSICAL EXAMINATION: ECOG PERFORMANCE STATUS: 0 - Asymptomatic  Vitals:   04/17/24 1138 04/17/24 1139  BP: (!) 141/62 (!) 133/57  Pulse: (!) 53   Resp: 17   Temp: 98.4 F (36.9 C)   SpO2: 100%  Filed Weights   04/17/24 1138  Weight: 143 lb 8 oz (65.1 kg)   Physical Exam Constitutional:      Appearance: Normal appearance.  HENT:     Head: Normocephalic and atraumatic.   Cardiovascular:     Rate and Rhythm: Normal rate and regular rhythm.     Pulses: Normal pulses.     Heart sounds: Normal heart sounds.  Pulmonary:     Effort: Pulmonary effort is normal.     Breath sounds: No rhonchi.  Abdominal:     General: Abdomen is flat. There is no distension.     Palpations: Abdomen is soft. There is no mass.     Tenderness: There is no abdominal tenderness.   Musculoskeletal:        General: No swelling or tenderness.     Cervical back: Normal range of motion and neck supple. No rigidity.  Lymphadenopathy:     Cervical: No cervical adenopathy.   Skin:    General: Skin is warm and dry.   Neurological:     General: No focal deficit present.     Mental Status: He is alert.   Psychiatric:        Mood and Affect: Mood normal.     LABORATORY DATA:  I have reviewed the data as listed Lab Results  Component Value Date   WBC 39.2 (H) 12/27/2023   HGB 11.6 (L) 12/27/2023   HCT 35.8 (L) 12/27/2023   MCV 91.6 12/27/2023   PLT 212 12/27/2023     Chemistry      Component Value  Date/Time   NA 143 12/27/2023 1137   K 4.2 12/27/2023 1137   CL 111 12/27/2023 1137   CO2 27 12/27/2023 1137   BUN 22 12/27/2023 1137   CREATININE 0.97 12/27/2023 1137   CREATININE 1.10 07/22/2020 1358      Component Value Date/Time   CALCIUM 9.2 12/27/2023 1137   ALKPHOS 82 09/20/2023 1138   AST 26 09/20/2023 1138   ALT 18 09/20/2023 1138   BILITOT 0.6 09/20/2023 1138     Flow consistent with CLL, 13 q deletion, favorable prognosis. CBC from today reviewed.  RADIOGRAPHIC STUDIES: I have personally reviewed the radiological images as listed and agreed with the findings in the report.  No results found.  All questions were answered. The patient knows to call the clinic with any problems, questions or concerns.    Murleen Arms, MD 04/17/2024 12:22 PM

## 2024-05-22 ENCOUNTER — Inpatient Hospital Stay: Attending: Hematology and Oncology

## 2024-05-22 DIAGNOSIS — C9110 Chronic lymphocytic leukemia of B-cell type not having achieved remission: Secondary | ICD-10-CM | POA: Diagnosis present

## 2024-05-22 LAB — CMP (CANCER CENTER ONLY)
ALT: 20 U/L (ref 0–44)
AST: 30 U/L (ref 15–41)
Albumin: 4.5 g/dL (ref 3.5–5.0)
Alkaline Phosphatase: 90 U/L (ref 38–126)
Anion gap: 6 (ref 5–15)
BUN: 20 mg/dL (ref 8–23)
CO2: 29 mmol/L (ref 22–32)
Calcium: 9.3 mg/dL (ref 8.9–10.3)
Chloride: 106 mmol/L (ref 98–111)
Creatinine: 1.08 mg/dL (ref 0.61–1.24)
GFR, Estimated: >60 mL/min (ref >60)
Glucose, Bld: 86 mg/dL (ref 70–99)
Potassium: 4.3 mmol/L (ref 3.5–5.1)
Sodium: 141 mmol/L (ref 135–145)
Total Bilirubin: 0.5 mg/dL (ref 0.0–1.2)
Total Protein: 7.1 g/dL (ref 6.5–8.1)

## 2024-05-22 LAB — CBC WITH DIFFERENTIAL/PLATELET
Abs Immature Granulocytes: 0.05 K/uL (ref 0.00–0.07)
Basophils Absolute: 0.2 K/uL — ABNORMAL HIGH (ref 0.0–0.1)
Basophils Relative: 0 %
Eosinophils Absolute: 0.1 K/uL (ref 0.0–0.5)
Eosinophils Relative: 0 %
HCT: 41.1 % (ref 39.0–52.0)
Hemoglobin: 13.3 g/dL (ref 13.0–17.0)
Immature Granulocytes: 0 %
Lymphocytes Relative: 94 %
Lymphs Abs: 56.4 K/uL — ABNORMAL HIGH (ref 0.7–4.0)
MCH: 30.2 pg (ref 26.0–34.0)
MCHC: 32.4 g/dL (ref 30.0–36.0)
MCV: 93.2 fL (ref 80.0–100.0)
Monocytes Absolute: 1.3 K/uL — ABNORMAL HIGH (ref 0.1–1.0)
Monocytes Relative: 2 %
Neutro Abs: 2.4 K/uL (ref 1.7–7.7)
Neutrophils Relative %: 4 %
Platelets: 111 K/uL — ABNORMAL LOW (ref 150–400)
RBC: 4.41 MIL/uL (ref 4.22–5.81)
RDW: 14.4 % (ref 11.5–15.5)
Smear Review: NORMAL
WBC: 60.4 K/uL (ref 4.0–10.5)
nRBC: 0 % (ref 0.0–0.2)

## 2024-06-16 ENCOUNTER — Encounter: Payer: Self-pay | Admitting: Hematology and Oncology

## 2024-06-16 ENCOUNTER — Telehealth: Payer: Self-pay | Admitting: *Deleted

## 2024-06-16 NOTE — Telephone Encounter (Signed)
 Noted My Chart message left by pt stating onset of cough with green phlegm and request for Zpak.  This RN called pt and informed him concern for above information left per My Chart due to more urgent concern and messages are not checked immediately upon sending.  Informed him at time of reading MD out of the office - but his concern needs to be addressed by seeing an Urgent care or going to [facility] web site to do an online visit  Pt verbalized understanding.

## 2024-06-22 NOTE — Progress Notes (Signed)
   I, Harold Benton am a scribe for Dr. Artist Lloyd, MD.  Harold Benton is a 63 y.o. male who presents to [facility] today for L wrist and R heel pain. Pt was previously seen by Dr. Lloyd in 2023 for R knee pain. August 1st played tennis. The next day could not grip anything in his hand. July 4th was running on the beach with grandson and he felt something but it didn't limit him doing anything.    Today, pt c/o L wrist pain x 2 out of 10 pain level. Pt locates pain to outside near the bone.   Pt also c/o R heel pain x 4 out of 10 pain. Pt locates pain to achilles area.   Aggravates: heel (get up in the morning), Wrist Supination and elbow flexion (bicep curl on preacher bench) Treatments tried: none  Pertinent review of systems: No fevers or chills  Relevant historical information: CLL.  Had a meniscus tear and surgery a few years ago right knee.   Exam:  BP 110/70   Pulse 61   Ht 5' 10 (1.778 m)   Wt 145 lb (65.8 kg)   BMI 20.81 kg/m  General: Well Developed, well nourished, and in no acute distress.   MSK: Right heel some swelling posterior calcaneus.  Tender palpation in this region normal foot and ankle motion intact strength.  Left wrist normal-appearing.  Normal motion.  Some pain with resisted extension and ulnar deviation. Pulses cap refill and sensation intact distally.    Lab and Radiology Results  Diagnostic Limited MSK Ultrasound of: Right Achilles tendon and left wrist  Right Achilles tendon: Achilles tendon is thicker diameter that would be typical measuring 0.7 cm normal would be typically 0.4.  No visible tear.  Increased vascular activity present on Doppler typical for tendinitis.  Left wrist: Some hypoechoic fluid surrounds the extensor carpi ulnaris tendon.  Normal TFCC appearance.  Normal ECU appearance.  Impression: Right Achilles tendinitis and left wrist ECU tenosynovitis.  X-ray images left wrist obtained today  personally and independently interpreted. Mild degenerative changes.  No acute fractures are visible. Await formal radiology review  Assessment and Plan: 63 y.o. male with right ankle Achilles tendinitis.  Plan for home exercise program.  Consider nitroglycerin patch protocol if needed.  Consider night splint.  Handout provided.  Home exercises taught in clinic.  Left wrist ECU tenosynovitis.  Plan for home exercise program today.  If not improving consider OT referral.   PDMP not reviewed this encounter. Orders Placed This Encounter  Procedures   US  LIMITED JOINT SPACE STRUCTURES UP LEFT(NO LINKED CHARGES)    Reason for Exam (SYMPTOM  OR DIAGNOSIS REQUIRED):   wrist pain    Preferred imaging location?:   [facility] Wrist Complete Left    Standing Status:   Future    Number of Occurrences:   1    Expiration Date:   06/26/2025    Reason for Exam (SYMPTOM  OR DIAGNOSIS REQUIRED):   eval left wist pain    Preferred imaging location?:   [facility]   No orders of the defined types were placed in this encounter.    Discussed warning signs or symptoms. Please see discharge instructions. Patient expresses understanding.   The above documentation has been reviewed and is accurate and complete Artist Lloyd, M.D.

## 2024-06-26 ENCOUNTER — Ambulatory Visit: Admitting: Sports Medicine

## 2024-06-26 ENCOUNTER — Other Ambulatory Visit: Payer: Self-pay

## 2024-06-26 ENCOUNTER — Ambulatory Visit (INDEPENDENT_AMBULATORY_CARE_PROVIDER_SITE_OTHER)

## 2024-06-26 ENCOUNTER — Telehealth: Payer: Self-pay | Admitting: Hematology and Oncology

## 2024-06-26 ENCOUNTER — Ambulatory Visit (INDEPENDENT_AMBULATORY_CARE_PROVIDER_SITE_OTHER): Admitting: Family Medicine

## 2024-06-26 VITALS — BP 110/70 | HR 61 | Ht 70.0 in | Wt 145.0 lb

## 2024-06-26 DIAGNOSIS — M25532 Pain in left wrist: Secondary | ICD-10-CM

## 2024-06-26 DIAGNOSIS — M7661 Achilles tendinitis, right leg: Secondary | ICD-10-CM

## 2024-06-26 NOTE — Patient Instructions (Addendum)
 Thank you for coming in today.   Work on heel exercises.   Go from up to down slowly.   Consider night splint for plantar fasciitis.   We could add nitroglycerine patches.   In the [employer] Tendinitis Tendinitis is inflammation in the tissues that connect muscles to bone (tendons). The extensor carpi ulnaris (ECU) tendon is located on the back of the wrist, on the side that is closest to the small finger. ECU tendinitis will cause pain on the small finger side of the wrist or forearm. It is a common sports injury. What are the causes? This condition may be caused by: Putting repeated stress on your wrist. Using the wrong technique while playing sports like golf or tennis. Weakening of the tendon due to age or a health problem. Injury or trauma. What increases the risk? You are more likely to develop this condition if: You play sports that involve repeated motions of the wrist, such as golf, tennis, or rugby. You are 63 years of age or older. You have an inflammatory condition such as rheumatoid arthritis. What are the signs or symptoms? Symptoms of this condition include: Pain along the small finger side of your wrist or forearm when moving your wrist. A constant ache on the small finger side of your wrist. Feeling like your grip is weaker than usual. A feeling like a pop, snap, or tear in your wrist. Wrist swelling. How is this diagnosed? This condition may be diagnosed based on: Your symptoms and medical history. A physical exam. During the exam, your health care provider will check the strength of your grip and may ask you to move your wrist. You may also have an MRI or ultrasound test to check for tears in your ligaments, muscles, or tendons. How is this treated? Treatment for this condition may include: Resting your wrist. Wearing a splint on your wrist. Medicines to help with pain and swelling. Applying heat or ice to the area to ease pain. Exercises  to help you maintain strength and range of motion in your wrist (physical therapy). Therapy to help with everyday activities (occupational therapy). If these treatments do not help, you may need an injection of an anti-inflammatory medicine (steroid) mixed with a numbing medicine (local anesthetic). Follow these instructions at home: If you have a removable splint:  Wear the splint as told by your provider. Remove it only as told by your provider. Check the skin around the splint every day. Tell your provider about any concerns. Loosen the splint if your fingers tingle, become numb, or turn cold and blue. Keep the splint clean. If the splint is not waterproof: Do not let it get wet. Cover it with a watertight covering when you take a bath or shower. Remove the splint for bathing and washing your hand only if your provider tells you to. Managing pain, stiffness, and swelling     If told, put ice on the injured area. If you have a removable splint, remove it as told by your provider. Put ice in a plastic bag. Place a towel between your skin and the bag. Leave the ice on for 20 minutes, 2-3 times a day. If told, apply heat to the affected area as often as told by your provider. Use the heat source that your provider recommends, such as a moist heat pack or a heating pad. If you have a removable splint, remove it as told by your provider. Place a towel between your skin and the heat  source. Leave the heat on for 15-20 minutes. If your skin turns bright red, remove the ice or heat right away to prevent skin damage. The risk of damage is higher if you cannot feel pain, heat, or cold. Move your fingers often to reduce stiffness and swelling. Raise (elevate) the injured area above the level of your heart while you are sitting or lying down. Activity Avoid activities that cause pain or put strain on your wrist for as long as told by your provider. Do not use your injured hand to support your  body weight until your provider says that you can. Return to your normal activities as told by your provider. Ask your provider what activities are safe for you. Do exercises as told by your provider. Ask your provider when it is safe to drive if you have a splint on your wrist. General instructions Take over-the-counter and prescription medicines only as told by your provider. Do not use any products that contain nicotine or tobacco. These products include cigarettes, chewing tobacco, and vaping devices, such as e-cigarettes. These can delay healing. If you need help quitting, ask your provider. How is this prevented? Warm up and stretch before being active. Cool down and stretch after being active. Give your body time to rest between periods of activity. Use equipment that fits you and is in good condition. If you play golf or racket sports, focus on proper form or try to improve your technique. Exercise to keep your body healthy. Work on your strength, flexibility, and endurance. Contact a health care provider if: Your pain, tenderness, or swelling gets worse. Get help right away if: Your pain becomes severe. You cannot move your wrist. This information is not intended to replace advice given to you by your health care provider. Make sure you discuss any questions you have with your health care provider. Document Revised: 07/31/2022 Document Reviewed: 07/31/2022 Elsevier Patient Education  2024 Elsevier Inc. I think you have ECU tendonitis or TFCC injury.

## 2024-06-26 NOTE — Telephone Encounter (Signed)
 Harold Benton has re-scheduled his appointment for the following Monday per his request.

## 2024-07-10 ENCOUNTER — Ambulatory Visit: Payer: Self-pay | Admitting: Family Medicine

## 2024-07-10 NOTE — Progress Notes (Signed)
Left wrist x-ray looks normal to radiology

## 2024-07-17 ENCOUNTER — Ambulatory Visit: Admitting: Hematology and Oncology

## 2024-07-17 ENCOUNTER — Other Ambulatory Visit

## 2024-07-24 ENCOUNTER — Inpatient Hospital Stay: Admitting: Hematology and Oncology

## 2024-07-24 ENCOUNTER — Inpatient Hospital Stay

## 2024-07-31 ENCOUNTER — Inpatient Hospital Stay: Admitting: Hematology and Oncology

## 2024-07-31 ENCOUNTER — Encounter: Payer: Self-pay | Admitting: Hematology and Oncology

## 2024-07-31 ENCOUNTER — Inpatient Hospital Stay

## 2024-08-28 ENCOUNTER — Inpatient Hospital Stay: Admitting: Hematology and Oncology

## 2024-08-28 ENCOUNTER — Inpatient Hospital Stay: Attending: Hematology and Oncology

## 2024-08-28 VITALS — BP 132/56 | HR 58 | Temp 97.6°F | Resp 17 | Wt 143.6 lb

## 2024-08-28 DIAGNOSIS — Z8547 Personal history of malignant neoplasm of testis: Secondary | ICD-10-CM | POA: Insufficient documentation

## 2024-08-28 DIAGNOSIS — Z8701 Personal history of pneumonia (recurrent): Secondary | ICD-10-CM | POA: Diagnosis not present

## 2024-08-28 DIAGNOSIS — C9110 Chronic lymphocytic leukemia of B-cell type not having achieved remission: Secondary | ICD-10-CM | POA: Insufficient documentation

## 2024-08-28 DIAGNOSIS — Z807 Family history of other malignant neoplasms of lymphoid, hematopoietic and related tissues: Secondary | ICD-10-CM | POA: Insufficient documentation

## 2024-08-28 DIAGNOSIS — D72820 Lymphocytosis (symptomatic): Secondary | ICD-10-CM | POA: Diagnosis present

## 2024-08-28 LAB — CBC WITH DIFFERENTIAL/PLATELET
Abs Immature Granulocytes: 0.04 K/uL (ref 0.00–0.07)
Basophils Absolute: 0.1 K/uL (ref 0.0–0.1)
Basophils Relative: 0 %
Eosinophils Absolute: 0 K/uL (ref 0.0–0.5)
Eosinophils Relative: 0 %
HCT: 38.3 % — ABNORMAL LOW (ref 39.0–52.0)
Hemoglobin: 12.5 g/dL — ABNORMAL LOW (ref 13.0–17.0)
Immature Granulocytes: 0 %
Lymphocytes Relative: 92 %
Lymphs Abs: 51.6 K/uL — ABNORMAL HIGH (ref 0.7–4.0)
MCH: 30.1 pg (ref 26.0–34.0)
MCHC: 32.6 g/dL (ref 30.0–36.0)
MCV: 92.3 fL (ref 80.0–100.0)
Monocytes Absolute: 1.7 K/uL — ABNORMAL HIGH (ref 0.1–1.0)
Monocytes Relative: 3 %
Neutro Abs: 2.8 K/uL (ref 1.7–7.7)
Neutrophils Relative %: 5 %
Platelets: 90 K/uL — ABNORMAL LOW (ref 150–400)
RBC: 4.15 MIL/uL — ABNORMAL LOW (ref 4.22–5.81)
RDW: 14.2 % (ref 11.5–15.5)
Smear Review: NORMAL
WBC: 56.1 K/uL (ref 4.0–10.5)
nRBC: 0 % (ref 0.0–0.2)

## 2024-08-28 LAB — CMP (CANCER CENTER ONLY)
ALT: 28 U/L (ref 0–44)
AST: 36 U/L (ref 15–41)
Albumin: 4.4 g/dL (ref 3.5–5.0)
Alkaline Phosphatase: 91 U/L (ref 38–126)
Anion gap: 5 (ref 5–15)
BUN: 23 mg/dL (ref 8–23)
CO2: 30 mmol/L (ref 22–32)
Calcium: 9.6 mg/dL (ref 8.9–10.3)
Chloride: 106 mmol/L (ref 98–111)
Creatinine: 1.15 mg/dL (ref 0.61–1.24)
GFR, Estimated: >60 mL/min (ref >60)
Glucose, Bld: 78 mg/dL (ref 70–99)
Potassium: 5 mmol/L (ref 3.5–5.1)
Sodium: 141 mmol/L (ref 135–145)
Total Bilirubin: 0.6 mg/dL (ref 0.0–1.2)
Total Protein: 6.8 g/dL (ref 6.5–8.1)

## 2024-08-28 LAB — LACTATE DEHYDROGENASE: LDH: 150 U/L (ref 98–192)

## 2024-08-28 NOTE — Progress Notes (Signed)
  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 Platelet count at 90,000, first instance below 100,000. 13q deletion present, no IgVH hypermutation. CLL not high risk, asymptomatic. 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 - Repeat blood work in 8 weeks to monitor WBC and platelet counts. - Consider initiation of treatment with acalabrutinib if platelet count remains below 100,000 or symptoms develop.   HISTORY OF PRESENTING ILLNESS:   Harold Benton 63 y.o. male is here because of lymphocytosis.  This is a very pleasant 63 year old male patient with past medical history significant for testicular cancer, recent pneumonia in February 2022 with multiple courses of antibiotics referred to hematology for evaluation of lymphocytosis.   Flow confirmed monoclonal B-cell population. FISH suggested mono allelic deletion of 13 q. 14 no evidence of 17 P/ATM or trisomy 12 IGVH somatic hyper mutation was not detected.  Interim History  Discussed the use of AI scribe software for clinical note transcription with the patient, who gave verbal consent to proceed.  History of Present Illness  Harold Benton is a 63 year old male with chronic lymphocytic leukemia (CLL) who presents for follow-up of his blood counts.  His white blood cell count has decreased from 60,000 to 56,000, with previous counts being 69,000 and 60,000. A year ago, his count was 43,000, then increased to 57,500, and later decreased to 39,200. He is not experiencing any new symptoms related to these changes.  His platelet count has been slowly dropping, with the most recent count at 90,000, which is the first time it has  fallen below 100,000. Previous counts were 111,000, 104,000, and 120,000.  No symptoms of bleeding are present.  He has not had recent evaluations for nutritional deficiencies like low iron, B12, or folic acid.  No fevers, night sweats, or recent infections, although he mentions a minor respiratory issue currently. He continues to exercise regularly and does not report any significant changes in his health.  Rest of the pertinent 10 point ROS reviewed and negative  MEDICAL HISTORY:  Past Medical History:  Diagnosis Date   Pneumonia    Testicular cancer (HCC) 1984    SURGICAL HISTORY: Past Surgical History:  Procedure Laterality Date   EXPLORATORY LAPAROTOMY  11/02/1982   INGUINAL HERNIA REPAIR Left 09/18/2022   Procedure: OPEN LEFT INDIRECT INGUINAL HERNIA REPAIR WITH MESH;  Surgeon: Gladis Cough, MD;  Location: [facility];  Service: General;  Laterality: Left;  indirect hernia with mesh   tecticle removal  11/02/1982   WISDOM TOOTH EXTRACTION      SOCIAL HISTORY: Social History   Socioeconomic History   Marital status: Married    Spouse name: Not on file   Number of children: Not on file   Years of education: Not on file   Highest education level: Bachelor's degree (e.g., BA, AB, BS)  Occupational History   Not on file  Tobacco Use   Smoking status: Never   Smokeless tobacco: Never  Vaping Use   Vaping status: Never Used  Substance and Sexual Activity   Alcohol use: Yes    Alcohol/week: 6.0 standard drinks of alcohol    Types: 6 Cans of beer per week   Drug use: No   Sexual activity: Yes  Other Topics Concern   Not on file  Social History Narrative   Not on file   Social Drivers of Health   Financial Resource Strain: Low Risk  (01/12/2022)   Overall Financial Resource Strain (CARDIA)    Difficulty of Paying Living Expenses: Not hard at all  Food Insecurity: No Food Insecurity (01/12/2022)   Hunger Vital Sign    Worried About Running Out of Food in the Last  Year: Never true    Ran Out of Food in the Last Year: Never true  Transportation Needs: No Transportation Needs (01/12/2022)   PRAPARE - Administrator, Civil Service (Medical): No    Lack of Transportation (Non-Medical): No  Physical Activity: Sufficiently Active (01/12/2022)   Exercise Vital Sign    Days of Exercise per Week: 5 days    Minutes of Exercise per Session: 40 min  Stress: No Stress Concern Present (01/12/2022)   [employer] of Occupational Health - Occupational Stress Questionnaire    Feeling of Stress : Not at all  Social Connections: Socially Integrated (01/12/2022)   Social Connection and Isolation Panel    Frequency of Communication with Friends and Family: More than three times a week    Frequency of Social Gatherings with Friends and Family: More than three times a week    Attends Religious Services: More than 4 times per year    Active Member of [employer] or Organizations: Yes    Attends Engineer, Structural: More than 4 times per year    Marital Status: Married  Catering Manager Violence: Not on file    FAMILY HISTORY: Family History  Problem Relation Age of Onset   Cancer Mother        ?type   Cancer Father        CLL   Alcohol abuse Sister    Colon cancer Neg Hx    Stomach cancer Neg Hx     ALLERGIES:  has no known allergies.  MEDICATIONS:  Current Outpatient Medications  Medication Sig Dispense Refill   Ascorbic Acid (VITAMIN C) 1000 MG tablet Take 1,000 mg by mouth daily.     CALCIUM-VITAMIN D PO Take 1 tablet by mouth daily.     Multiple Vitamin (MULTIVITAMIN) tablet Take 1 tablet by mouth daily.     No current facility-administered medications for this visit.    PHYSICAL EXAMINATION: ECOG PERFORMANCE STATUS: 0 - Asymptomatic  Vitals:   08/28/24 1139  BP: (!) 132/56  Pulse: (!) 58  Resp: 17  Temp: 97.6 F (36.4 C)  SpO2: 100%    Filed Weights   08/28/24 1139  Weight: 143 lb 9.6 oz (65.1 kg)   Physical  Exam Constitutional:      Appearance: Normal appearance.  HENT:     Head: Normocephalic and atraumatic.  Cardiovascular:     Rate and Rhythm: Normal rate and regular rhythm.     Pulses: Normal pulses.     Heart sounds: Normal heart sounds.  Pulmonary:     Effort: Pulmonary effort is normal.     Breath sounds: No rhonchi.  Abdominal:     General: Abdomen is flat. There is no distension.     Palpations: Abdomen is soft. There is no mass.     Tenderness: There is no abdominal tenderness.  Musculoskeletal:        General: No swelling or tenderness.     Cervical back: Normal range of motion and neck supple. No rigidity.  Lymphadenopathy:  Cervical: No cervical adenopathy.  Skin:    General: Skin is warm and dry.  Neurological:     General: No focal deficit present.     Mental Status: He is alert.  Psychiatric:        Mood and Affect: Mood normal.     LABORATORY DATA:  I have reviewed the data as listed Lab Results  Component Value Date   WBC 56.1 (HH) 08/28/2024   HGB 12.5 (L) 08/28/2024   HCT 38.3 (L) 08/28/2024   MCV 92.3 08/28/2024   PLT 90 (L) 08/28/2024     Chemistry      Component Value Date/Time   NA 141 08/28/2024 1107   K 5.0 08/28/2024 1107   CL 106 08/28/2024 1107   CO2 30 08/28/2024 1107   BUN 23 08/28/2024 1107   CREATININE 1.15 08/28/2024 1107   CREATININE 1.10 07/22/2020 1358      Component Value Date/Time   CALCIUM 9.6 08/28/2024 1107   ALKPHOS 91 08/28/2024 1107   AST 36 08/28/2024 1107   ALT 28 08/28/2024 1107   BILITOT 0.6 08/28/2024 1107     Labs from today reviewed.  RADIOGRAPHIC STUDIES: I have personally reviewed the radiological images as listed and agreed with the findings in the report.  No results found.  All questions were answered. The patient knows to call the clinic with any problems, questions or concerns.    Amber Stalls, MD 08/28/2024 11:55 AM

## 2024-09-07 ENCOUNTER — Encounter: Payer: Self-pay | Admitting: Family Medicine

## 2024-09-08 MED ORDER — AZITHROMYCIN 250 MG PO TABS: ORAL_TABLET | ORAL | 0 refills | Status: AC

## 2024-09-25 ENCOUNTER — Ambulatory Visit (INDEPENDENT_AMBULATORY_CARE_PROVIDER_SITE_OTHER): Admitting: Family Medicine

## 2024-09-25 ENCOUNTER — Ambulatory Visit (INDEPENDENT_AMBULATORY_CARE_PROVIDER_SITE_OTHER)

## 2024-09-25 VITALS — BP 130/60 | HR 57 | Temp 97.8°F | Wt 143.6 lb

## 2024-09-25 DIAGNOSIS — R059 Cough, unspecified: Secondary | ICD-10-CM

## 2024-09-25 MED ORDER — LEVOFLOXACIN 750 MG PO TABS: 750.0000 mg | ORAL_TABLET | Freq: Every day | ORAL | 0 refills | Status: AC

## 2024-09-25 NOTE — Progress Notes (Signed)
 Established Patient Office Visit  Subjective   Patient ID: Harold Benton, male    DOB: 1960-11-27  Age: 63 y.o. MRN: [id number]  Chief Complaint  Patient presents with   Cough   Nasal Congestion   Generalized Body Aches    HPI   Harold Benton is here with cough and recent fever.  Couple weeks ago he developed productive cough and we actually called in Zithromax .  He was feeling better and then last Wednesday went for a run.  Afterwards had some increasing cough..  He had family Thanksgiving get together over the weekend and afterwards last night developed fever 101.4 with some shaking chill.  Took some Advil.  Is had increasing productive cough and some bodyaches.  Has had pneumonia in the past and is concerned about the same at this time.  No nausea or vomiting.  No significant sore throat.  He has CLL followed by hematology.  Remote history of testicular cancer.  Past Medical History:  Diagnosis Date   Pneumonia    Testicular cancer (HCC) 1984   Past Surgical History:  Procedure Laterality Date   EXPLORATORY LAPAROTOMY  11/02/1982   INGUINAL HERNIA REPAIR Left 09/18/2022   Procedure: OPEN LEFT INDIRECT INGUINAL HERNIA REPAIR WITH MESH;  Surgeon: Gladis Cough, MD;  Location: [facility];  Service: General;  Laterality: Left;  indirect hernia with mesh   tecticle removal  11/02/1982   WISDOM TOOTH EXTRACTION      reports that he has never smoked. He has never used smokeless tobacco. He reports current alcohol use of about 6.0 standard drinks of alcohol per week. He reports that he does not use drugs. family history includes Alcohol abuse in his sister; Cancer in his father and mother. No Known Allergies  Review of Systems  Constitutional:  Positive for chills and fever.  HENT:  Negative for sore throat.   Respiratory:  Positive for cough and sputum production. Negative for hemoptysis.   Cardiovascular:  Negative for chest pain.      Objective:     BP 130/60   Pulse (!) 57    Temp 97.8 F (36.6 C) (Oral)   Wt 143 lb 9.6 oz (65.1 kg)   SpO2 97%   BMI 20.60 kg/m  BP Readings from Last 3 Encounters:  09/25/24 130/60  08/28/24 (!) 132/56  06/26/24 110/70   Wt Readings from Last 3 Encounters:  09/25/24 143 lb 9.6 oz (65.1 kg)  08/28/24 143 lb 9.6 oz (65.1 kg)  06/26/24 145 lb (65.8 kg)      Physical Exam Vitals reviewed.  Constitutional:      General: He is not in acute distress.    Appearance: He is not ill-appearing.  Cardiovascular:     Rate and Rhythm: Normal rate and regular rhythm.  Pulmonary:     Effort: Pulmonary effort is normal.     Breath sounds: Normal breath sounds. No rales.  Neurological:     Mental Status: He is alert.      No results found for any visits on 09/25/24.    The ASCVD Risk score (Arnett DK, et al., 2019) failed to calculate for the following reasons:   Cannot find a previous HDL lab   Cannot find a previous total cholesterol lab    Assessment & Plan:   Problem List Items Addressed This Visit   None Visit Diagnoses       Cough, unspecified type    -  Primary   Relevant Orders  DG Chest 2 View     63 year old male with productive cough and fever.  Recent treatment with Zithromax .  Patient nontoxic in appearance.  Nonfocal lung exam.  Obtain PA and lateral chest x-ray.  Consider starting Levaquin  750 mg once daily for 7 days.  Follow-up for any dyspnea or other concerns  No follow-ups on file.    Wolm Scarlet, MD

## 2024-09-30 ENCOUNTER — Ambulatory Visit: Payer: Self-pay | Admitting: Family Medicine

## 2024-10-16 ENCOUNTER — Telehealth: Payer: Self-pay | Admitting: Hematology and Oncology

## 2024-10-16 NOTE — Telephone Encounter (Signed)
 Patient called in to reschedule appointments from 10/31/2024 to 10/20/2024. Patient aware of date/time change.

## 2024-10-20 ENCOUNTER — Inpatient Hospital Stay: Admitting: Hematology and Oncology

## 2024-10-20 ENCOUNTER — Encounter: Payer: Self-pay | Admitting: Hematology and Oncology

## 2024-10-20 ENCOUNTER — Other Ambulatory Visit: Payer: Self-pay

## 2024-10-20 ENCOUNTER — Telehealth: Payer: Self-pay

## 2024-10-20 ENCOUNTER — Inpatient Hospital Stay: Attending: Hematology and Oncology

## 2024-10-20 VITALS — BP 136/85 | HR 50 | Temp 98.0°F | Resp 18 | Wt 142.3 lb

## 2024-10-20 DIAGNOSIS — Z8547 Personal history of malignant neoplasm of testis: Secondary | ICD-10-CM | POA: Diagnosis not present

## 2024-10-20 DIAGNOSIS — Z8701 Personal history of pneumonia (recurrent): Secondary | ICD-10-CM | POA: Diagnosis not present

## 2024-10-20 DIAGNOSIS — D72820 Lymphocytosis (symptomatic): Secondary | ICD-10-CM | POA: Diagnosis present

## 2024-10-20 DIAGNOSIS — C9110 Chronic lymphocytic leukemia of B-cell type not having achieved remission: Secondary | ICD-10-CM

## 2024-10-20 DIAGNOSIS — Z806 Family history of leukemia: Secondary | ICD-10-CM | POA: Insufficient documentation

## 2024-10-20 DIAGNOSIS — J069 Acute upper respiratory infection, unspecified: Secondary | ICD-10-CM | POA: Insufficient documentation

## 2024-10-20 LAB — CBC WITH DIFFERENTIAL/PLATELET
Abs Immature Granulocytes: 0.05 K/uL (ref 0.00–0.07)
Basophils Absolute: 0.1 K/uL (ref 0.0–0.1)
Basophils Relative: 0 %
Eosinophils Absolute: 0.1 K/uL (ref 0.0–0.5)
Eosinophils Relative: 0 %
HCT: 37.9 % — ABNORMAL LOW (ref 39.0–52.0)
Hemoglobin: 12.2 g/dL — ABNORMAL LOW (ref 13.0–17.0)
Immature Granulocytes: 0 %
Lymphocytes Relative: 93 %
Lymphs Abs: 54.7 K/uL — ABNORMAL HIGH (ref 0.7–4.0)
MCH: 29.5 pg (ref 26.0–34.0)
MCHC: 32.2 g/dL (ref 30.0–36.0)
MCV: 91.8 fL (ref 80.0–100.0)
Monocytes Absolute: 1.5 K/uL — ABNORMAL HIGH (ref 0.1–1.0)
Monocytes Relative: 3 %
Neutro Abs: 2.1 K/uL (ref 1.7–7.7)
Neutrophils Relative %: 4 %
Platelets: 118 K/uL — ABNORMAL LOW (ref 150–400)
RBC: 4.13 MIL/uL — ABNORMAL LOW (ref 4.22–5.81)
RDW: 15.4 % (ref 11.5–15.5)
Smear Review: NORMAL
WBC: 58.5 K/uL (ref 4.0–10.5)
nRBC: 0 % (ref 0.0–0.2)

## 2024-10-20 LAB — CMP (CANCER CENTER ONLY)
ALT: 25 U/L (ref 0–44)
AST: 31 U/L (ref 15–41)
Albumin: 4.4 g/dL (ref 3.5–5.0)
Alkaline Phosphatase: 86 U/L (ref 38–126)
Anion gap: 11 (ref 5–15)
BUN: 27 mg/dL — ABNORMAL HIGH (ref 8–23)
CO2: 25 mmol/L (ref 22–32)
Calcium: 9.3 mg/dL (ref 8.9–10.3)
Chloride: 107 mmol/L (ref 98–111)
Creatinine: 1.17 mg/dL (ref 0.61–1.24)
GFR, Estimated: >60 mL/min
Glucose, Bld: 101 mg/dL — ABNORMAL HIGH (ref 70–99)
Potassium: 4.7 mmol/L (ref 3.5–5.1)
Sodium: 143 mmol/L (ref 135–145)
Total Bilirubin: 0.5 mg/dL (ref 0.0–1.2)
Total Protein: 6.5 g/dL (ref 6.5–8.1)

## 2024-10-20 LAB — LACTATE DEHYDROGENASE: LDH: 157 U/L (ref 105–235)

## 2024-10-20 NOTE — Telephone Encounter (Signed)
 Received critical lab result from Amber at [facility]: WBC 58.5. Dr. Loretha notified

## 2024-10-20 NOTE — Progress Notes (Signed)
 [facility] CONSULT NOTE  Patient Care Team: Micheal Wolm ORN, MD as PCP - General  CHIEF COMPLAINTS/PURPOSE OF CONSULTATION:   ASSESSMENT & PLAN:   Assessment and Plan Assessment & Plan  Chronic lymphocytic leukemia, B-cell type, not in remission Asymptomatic, no B symptoms, preserved exercise tolerance, no new lymphadenopathy or splenomegaly. Not at treatment threshold. - Monitored CBC and clinical status; next follow-up in March. - Discussed potential future treatment options, including acalabrutinib, if treatment becomes indicated.  Recurrent upper respiratory tract infections Two to three episodes annually, consistent with baseline. Recent infection required antibiotics; chest radiograph showed hyperinflated lungs and basilar vascular scarring, likely from childhood bronchitis and prior infections. Recovered with retained exercise capacity. - Emphasized monitoring for increased frequency or severity of infections, as this may indicate need for CLL treatment. - Instructed to report prolonged recovery or increased frequency of infections.   HISTORY OF PRESENTING ILLNESS:   Harold Benton 63 y.o. male is here because of lymphocytosis.  This is a very pleasant 63 year old male patient with past medical history significant for testicular cancer, recent pneumonia in February 2022 with multiple courses of antibiotics referred to hematology for evaluation of lymphocytosis.   Flow confirmed monoclonal B-cell population. FISH suggested mono allelic deletion of 13 q. 14 no evidence of 17 P/ATM or trisomy 12 IGVH somatic hyper mutation was not detected.  Interim History  Discussed the use of AI scribe software for clinical note transcription with the patient, who gave verbal consent to proceed.  History of Present Illness  Harold Benton is a 63 year old male with chronic lymphocytic leukemia (CLL) who presents for routine follow-up and monitoring of stable  disease and recurrent upper respiratory tract infections.  Current white blood cell count is 58,500 and platelet count is 118,000. Over the past six months, his white blood cell count has ranged from 56,100 to 69,000, and his platelet count has increased from 90,000 to 118,000. He denies B symptoms, including fever, drenching night sweats, or weight loss, and maintains good appetite and exercise tolerance. No new medications have been initiated.  He continues to experience two to three upper respiratory tract infections annually, consistent with his longstanding pattern over the past decade. Since his last visit, he developed a cold with significant congestion, partially responsive to azithromycin  and subsequently treated with levofloxacin  for recurrent symptoms and concern for pneumonia. Chest x-ray during this episode revealed hyperinflated lungs and basilar vascular scarring. He denies increased frequency or severity of infections and is able to exercise as usual.  Following resumption of physical activity post-illness, he developed intermittent groin pain, alternating between left and right sides, with occasional severe right-sided pain in the mornings that limits bending. He suspects muscle strain related to exercise or lifting. He has a history of left inguinal hernia repair two years ago, but no similar symptoms on the right.  Rest of the pertinent 10 point ROS reviewed and negative  MEDICAL HISTORY:  Past Medical History:  Diagnosis Date   Pneumonia    Testicular cancer (HCC) 1984    SURGICAL HISTORY: Past Surgical History:  Procedure Laterality Date   EXPLORATORY LAPAROTOMY  11/02/1982   INGUINAL HERNIA REPAIR Left 09/18/2022   Procedure: OPEN LEFT INDIRECT INGUINAL HERNIA REPAIR WITH MESH;  Surgeon: Gladis Cough, MD;  Location: [facility];  Service: General;  Laterality: Left;  indirect hernia with mesh   tecticle removal  11/02/1982   WISDOM TOOTH EXTRACTION      SOCIAL  HISTORY: Social History  Socioeconomic History   Marital status: Married    Spouse name: Not on file   Number of children: Not on file   Years of education: Not on file   Highest education level: Bachelor's degree (e.g., BA, AB, BS)  Occupational History   Not on file  Tobacco Use   Smoking status: Never   Smokeless tobacco: Never  Vaping Use   Vaping status: Never Used  Substance and Sexual Activity   Alcohol use: Yes    Alcohol/week: 6.0 standard drinks of alcohol    Types: 6 Cans of beer per week   Drug use: No   Sexual activity: Yes  Other Topics Concern   Not on file  Social History Narrative   Not on file   Social Drivers of Health   Tobacco Use: Low Risk (09/25/2024)   Patient History    Smoking Tobacco Use: Never    Smokeless Tobacco Use: Never    Passive Exposure: Not on file  Financial Resource Strain: Low Risk (09/25/2024)   Overall Financial Resource Strain (CARDIA)    Difficulty of Paying Living Expenses: Not hard at all  Food Insecurity: No Food Insecurity (09/25/2024)   Epic    Worried About Programme Researcher, Broadcasting/film/video in the Last Year: Never true    Ran Out of Food in the Last Year: Never true  Transportation Needs: No Transportation Needs (09/25/2024)   Epic    Lack of Transportation (Medical): No    Lack of Transportation (Non-Medical): No  Physical Activity: Sufficiently Active (09/25/2024)   Exercise Vital Sign    Days of Exercise per Week: 5 days    Minutes of Exercise per Session: 30 min  Stress: No Stress Concern Present (09/25/2024)   [employer] of Occupational Health - Occupational Stress Questionnaire    Feeling of Stress: Only a little  Social Connections: Socially Integrated (09/25/2024)   Social Connection and Isolation Panel    Frequency of Communication with Friends and Family: More than three times a week    Frequency of Social Gatherings with Friends and Family: More than three times a week    Attends Religious Services: More  than 4 times per year    Active Member of Clubs or Organizations: Yes    Attends Banker Meetings: More than 4 times per year    Marital Status: Married  Catering Manager Violence: Not on file  Depression (PHQ2-9): Low Risk (09/25/2024)   Depression (PHQ2-9)    PHQ-2 Score: 0  Alcohol Screen: Low Risk (09/25/2024)   Alcohol Screen    Last Alcohol Screening Score (AUDIT): 4  Housing: Low Risk (09/25/2024)   Epic    Unable to Pay for Housing in the Last Year: No    Number of Times Moved in the Last Year: 0    Homeless in the Last Year: No  Utilities: Not on file  Health Literacy: Not on file    FAMILY HISTORY: Family History  Problem Relation Age of Onset   Cancer Mother        ?type   Cancer Father        CLL   Alcohol abuse Sister    Colon cancer Neg Hx    Stomach cancer Neg Hx     ALLERGIES:  has no known allergies.  MEDICATIONS:  Current Outpatient Medications  Medication Sig Dispense Refill   Ascorbic Acid (VITAMIN C) 1000 MG tablet Take 1,000 mg by mouth daily.     CALCIUM-VITAMIN D PO Take  1 tablet by mouth daily.     levofloxacin  (LEVAQUIN ) 750 MG tablet Take 1 tablet (750 mg total) by mouth daily. 7 tablet 0   Multiple Vitamin (MULTIVITAMIN) tablet Take 1 tablet by mouth daily.     No current facility-administered medications for this visit.    PHYSICAL EXAMINATION: ECOG PERFORMANCE STATUS: 0 - Asymptomatic  There were no vitals filed for this visit.   There were no vitals filed for this visit.  Physical Exam Constitutional:      Appearance: Normal appearance.  HENT:     Head: Normocephalic and atraumatic.  Cardiovascular:     Rate and Rhythm: Normal rate and regular rhythm.     Pulses: Normal pulses.     Heart sounds: Normal heart sounds.  Pulmonary:     Effort: Pulmonary effort is normal.     Breath sounds: No rhonchi.  Abdominal:     General: Abdomen is flat. There is no distension.     Palpations: Abdomen is soft. There is no  mass.     Tenderness: There is no abdominal tenderness.  Musculoskeletal:        General: No swelling or tenderness.     Cervical back: Normal range of motion and neck supple. No rigidity.  Lymphadenopathy:     Cervical: No cervical adenopathy.  Skin:    General: Skin is warm and dry.  Neurological:     General: No focal deficit present.     Mental Status: He is alert.  Psychiatric:        Mood and Affect: Mood normal.     LABORATORY DATA:  I have reviewed the data as listed Lab Results  Component Value Date   WBC 56.1 (HH) 08/28/2024   HGB 12.5 (L) 08/28/2024   HCT 38.3 (L) 08/28/2024   MCV 92.3 08/28/2024   PLT 90 (L) 08/28/2024     Chemistry      Component Value Date/Time   NA 141 08/28/2024 1107   K 5.0 08/28/2024 1107   CL 106 08/28/2024 1107   CO2 30 08/28/2024 1107   BUN 23 08/28/2024 1107   CREATININE 1.15 08/28/2024 1107   CREATININE 1.10 07/22/2020 1358      Component Value Date/Time   CALCIUM 9.6 08/28/2024 1107   ALKPHOS 91 08/28/2024 1107   AST 36 08/28/2024 1107   ALT 28 08/28/2024 1107   BILITOT 0.6 08/28/2024 1107     Labs from today reviewed.  RADIOGRAPHIC STUDIES: I have personally reviewed the radiological images as listed and agreed with the findings in the report.  DG Chest 2 View Result Date: 09/30/2024 CLINICAL DATA:  cough and fever EXAM: CHEST - 2 VIEW COMPARISON:  10/20/2021, 02/14/2021 FINDINGS: Similar hyperinflation. Lower lung scarring also involving the right middle lobe and lingula on the lateral view. No definite superimposed acute airspace process, collapse or consolidation. Negative for edema, large effusion or pneumothorax. Trachea midline. Normal heart size and vascularity. No acute osseous finding. IMPRESSION: Hyperinflation and basilar scarring. No acute chest process by plain radiography. Electronically Signed   By: CHRISTELLA.  Shick M.D.   On: 09/30/2024 10:52    All questions were answered. The patient knows to call the clinic  with any problems, questions or concerns.    Amber Stalls, MD 10/20/2024 8:18 AM

## 2024-10-31 ENCOUNTER — Inpatient Hospital Stay

## 2024-10-31 ENCOUNTER — Inpatient Hospital Stay: Admitting: Hematology and Oncology

## 2025-01-05 ENCOUNTER — Inpatient Hospital Stay: Attending: Hematology and Oncology

## 2025-01-05 ENCOUNTER — Inpatient Hospital Stay: Admitting: Hematology and Oncology
